# Patient Record
Sex: Female | Born: 1935 | Race: White | Hispanic: No | Smoking: Never smoker
Health system: Southern US, Community
[De-identification: ages and names within clinical notes are randomized; demographics above are authoritative.]

## PROBLEM LIST (undated history)

## (undated) DIAGNOSIS — R569 Unspecified convulsions: Secondary | ICD-10-CM

## (undated) DIAGNOSIS — F32A Depression, unspecified: Secondary | ICD-10-CM

## (undated) DIAGNOSIS — I10 Essential (primary) hypertension: Secondary | ICD-10-CM

## (undated) DIAGNOSIS — F0390 Unspecified dementia without behavioral disturbance: Secondary | ICD-10-CM

---

## 2017-09-08 DIAGNOSIS — E039 Hypothyroidism, unspecified: Secondary | ICD-10-CM | POA: Diagnosis present

## 2017-09-08 DIAGNOSIS — I10 Essential (primary) hypertension: Secondary | ICD-10-CM | POA: Diagnosis present

## 2017-09-08 DIAGNOSIS — F0390 Unspecified dementia without behavioral disturbance: Secondary | ICD-10-CM | POA: Diagnosis present

## 2019-01-13 DIAGNOSIS — G40909 Epilepsy, unspecified, not intractable, without status epilepticus: Secondary | ICD-10-CM | POA: Insufficient documentation

## 2020-02-16 DIAGNOSIS — G40919 Epilepsy, unspecified, intractable, without status epilepticus: Secondary | ICD-10-CM | POA: Insufficient documentation

## 2020-04-19 ENCOUNTER — Emergency Department (HOSPITAL_COMMUNITY): Payer: Medicare Other

## 2020-04-19 ENCOUNTER — Other Ambulatory Visit: Payer: Self-pay

## 2020-04-19 ENCOUNTER — Emergency Department (HOSPITAL_COMMUNITY)
Admission: EM | Admit: 2020-04-19 | Discharge: 2020-04-19 | Disposition: A | Discharge status: Home / Self Care | Payer: Medicare Other | Attending: Emergency Medicine | Admitting: Emergency Medicine

## 2020-04-19 DIAGNOSIS — R21 Rash and other nonspecific skin eruption: Secondary | ICD-10-CM

## 2020-04-19 DIAGNOSIS — F0390 Unspecified dementia without behavioral disturbance: Secondary | ICD-10-CM | POA: Diagnosis not present

## 2020-04-19 DIAGNOSIS — R4182 Altered mental status, unspecified: Secondary | ICD-10-CM | POA: Insufficient documentation

## 2020-04-19 LAB — URINALYSIS, ROUTINE W REFLEX MICROSCOPIC
Bacteria, UA: NONE SEEN
Bilirubin Urine: NEGATIVE
Glucose, UA: NEGATIVE mg/dL
Hgb urine dipstick: NEGATIVE
Ketones, ur: NEGATIVE mg/dL
Nitrite: NEGATIVE
Protein, ur: NEGATIVE mg/dL
Specific Gravity, Urine: 1.017 (ref 1.005–1.030)
pH: 6 (ref 5.0–8.0)

## 2020-04-19 LAB — CBC WITH DIFFERENTIAL/PLATELET
Abs Immature Granulocytes: 0.01 10*3/uL (ref 0.00–0.07)
Basophils Absolute: 0.1 10*3/uL (ref 0.0–0.1)
Basophils Relative: 1 %
Eosinophils Absolute: 0.3 10*3/uL (ref 0.0–0.5)
Eosinophils Relative: 7 %
HCT: 35 % — ABNORMAL LOW (ref 36.0–46.0)
Hemoglobin: 11.3 g/dL — ABNORMAL LOW (ref 12.0–15.0)
Immature Granulocytes: 0 %
Lymphocytes Relative: 18 %
Lymphs Abs: 0.9 10*3/uL (ref 0.7–4.0)
MCH: 27.6 pg (ref 26.0–34.0)
MCHC: 32.3 g/dL (ref 30.0–36.0)
MCV: 85.6 fL (ref 80.0–100.0)
Monocytes Absolute: 0.6 10*3/uL (ref 0.1–1.0)
Monocytes Relative: 12 %
Neutro Abs: 3.2 10*3/uL (ref 1.7–7.7)
Neutrophils Relative %: 62 %
Platelets: 212 10*3/uL (ref 150–400)
RBC: 4.09 MIL/uL (ref 3.87–5.11)
RDW: 15.2 % (ref 11.5–15.5)
WBC: 5.2 10*3/uL (ref 4.0–10.5)
nRBC: 0 % (ref 0.0–0.2)

## 2020-04-19 LAB — COMPREHENSIVE METABOLIC PANEL
ALT: 18 U/L (ref 0–44)
AST: 20 U/L (ref 15–41)
Albumin: 3.5 g/dL (ref 3.5–5.0)
Alkaline Phosphatase: 78 U/L (ref 38–126)
Anion gap: 5 (ref 5–15)
BUN: 17 mg/dL (ref 8–23)
CO2: 27 mmol/L (ref 22–32)
Calcium: 8.8 mg/dL — ABNORMAL LOW (ref 8.9–10.3)
Chloride: 105 mmol/L (ref 98–111)
Creatinine, Ser: 1.01 mg/dL — ABNORMAL HIGH (ref 0.44–1.00)
GFR, Estimated: 54 mL/min — ABNORMAL LOW (ref >60)
Glucose, Bld: 92 mg/dL (ref 70–99)
Potassium: 3.5 mmol/L (ref 3.5–5.1)
Sodium: 137 mmol/L (ref 135–145)
Total Bilirubin: 1.1 mg/dL (ref 0.3–1.2)
Total Protein: 6.8 g/dL (ref 6.5–8.1)

## 2020-04-19 LAB — TROPONIN I (HIGH SENSITIVITY)
Troponin I (High Sensitivity): 18 ng/L — ABNORMAL HIGH (ref <18)
Troponin I (High Sensitivity): 16 ng/L (ref <18)

## 2020-04-19 MED ORDER — NYSTATIN 100000 UNIT/GM EX CREA: TOPICAL_CREAM | CUTANEOUS | 0 refills | Status: DC

## 2020-04-19 MED ORDER — SODIUM CHLORIDE 0.9 % IV BOLUS
500.0000 mL | Freq: Once | INTRAVENOUS | Status: AC
  Administered 2020-04-19: 500 mL via INTRAVENOUS

## 2020-04-19 NOTE — ED Notes (Signed)
IV access established, pt tolerated well. Pt is sleeping at bedside. Easily awakened. Pt on monitor x4. Only c/o back pain ( chronic) fluids infusing. Will continue to monitor.

## 2020-04-19 NOTE — ED Triage Notes (Signed)
Pt to the ER after staff states AMS. Pt was referred to ER for evaluation. Pt said to be A& O x1 when she is normally A& O x3. cbg 329 per EMS with no history of diabetes.

## 2020-04-19 NOTE — ED Provider Notes (Signed)
Pt seen by Dr Judd Lien.  Please see his note.  Pt comes from a nursing facility.  Initial workup normal.  UA pending.  Anticipate dc.  Clinical Course as of 04/19/20 1501  Mon Apr 19, 2020  1300 UA not definitive for UTI [JK]  1458 Troponin normal.  [JK]    Clinical Course User Index [JK] Linwood Dibbles, MD      Linwood Dibbles, MD 04/19/20 1501

## 2020-04-19 NOTE — ED Provider Notes (Signed)
[facility]-EMERGENCY DEPT Provider Note   CSN: [id number] Arrival date & time: 04/19/20  5852     History No chief complaint on file.   Heidi Bean is a 87 y.o. female.  Patient is an 87 year old female with history of dementia.  She is sent from her extended care facility for evaluation of altered mental status.  According to the staff there, patient was alert and oriented x1 at 4 AM when she is normally alert and oriented x3.  There is a question of a nonreactive pupil on the right.  Patient denies to me she is experiencing any discomfort and has no idea why she is here.  She denies headache, falls, chest pain, difficulty breathing, or other complaints.  The history is provided by the patient, the EMS personnel and the nursing home.       No past medical history on file.  There are no problems to display for this patient.      OB History   No obstetric history on file.     No family history on file.     Home Medications Prior to Admission medications   Not on File    Allergies    Patient has no known allergies.  Review of Systems   Review of Systems  Unable to perform ROS: Dementia    Physical Exam Updated Vital Signs BP (!) 148/69 (BP Location: Left Arm)   Pulse 62   Temp 97.9 F (36.6 C) (Oral)   Resp 16   SpO2 96%   Physical Exam Vitals and nursing note reviewed.  Constitutional:      General: She is not in acute distress.    Appearance: She is well-developed and well-nourished. She is not diaphoretic.  HENT:     Head: Normocephalic and atraumatic.  Eyes:     Extraocular Movements: Extraocular movements intact.     Comments: The right pupil is elliptical in shape and I suspect related to prior eye procedure.  The left pupil is 3 mm and reactive.  Cardiovascular:     Rate and Rhythm: Normal rate and regular rhythm.     Heart sounds: No murmur heard. No friction rub. No gallop.   Pulmonary:     Effort: Pulmonary effort  is normal. No respiratory distress.     Breath sounds: Normal breath sounds. No wheezing.  Abdominal:     General: Bowel sounds are normal. There is no distension.     Palpations: Abdomen is soft.     Tenderness: There is no abdominal tenderness.  Musculoskeletal:        General: No swelling or tenderness. Normal range of motion.     Cervical back: Normal range of motion and neck supple.     Right lower leg: No edema.     Left lower leg: No edema.  Skin:    General: Skin is warm and dry.  Neurological:     Mental Status: She is alert.     Comments: Patient is awake and alert.  She answers questions appropriately and follows commands.  She moves all 4 extremities.     ED Results / Procedures / Treatments   Labs (all labs ordered are listed, but only abnormal results are displayed) Labs Reviewed  CBC WITH DIFFERENTIAL/PLATELET  URINALYSIS, ROUTINE W REFLEX MICROSCOPIC  COMPREHENSIVE METABOLIC PANEL  TROPONIN I (HIGH SENSITIVITY)    EKG EKG Interpretation  Date/Time:  Monday April 19 2020 06:03:27 EST Ventricular Rate:  63 PR Interval:  QRS Duration: 96 QT Interval:  430 QTC Calculation: 441 R Axis:   81 Text Interpretation: Sinus rhythm Borderline right axis deviation Low voltage, precordial leads Confirmed by Geoffery Lyons ([id number]) on 04/19/2020 6:08:57 AM   Radiology No results found.  Procedures Procedures (including critical care time)  Medications Ordered in ED Medications  sodium chloride 0.9 % bolus 500 mL (has no administration in time range)    ED Course  I have reviewed the triage vital signs and the nursing notes.  Pertinent labs & imaging results that were available during my care of the patient were reviewed by me and considered in my medical decision making (see chart for details).    MDM Rules/Calculators/A&P  With history of dementia and is a resident of [facility] nursing home.  She was sent here for confusion and disorientation.  Patient  arrives here alert and appropriate.  She has no complaints.  Work-up shows no significant abnormality in her CBC or basic metabolic panel.  Her EKG shows a sinus rhythm and troponin is unremarkable.  Head CT shows no acute process and patient is moving all extremities with no limitation.  She follows commands appropriately and is mentating appropriately.  We have little information at our facility on this patient as she is from [facility], then lived in [place] for 1 year with her son, then came to live here with the daughter about 1 year ago.  She has since been placed at [facility] since Thanksgiving.  I have discussed the situation with the patient's son in [place] and daughter in [place].  I have explained the results of the tests thus far and that patient will be likely discharged.  Her urinalysis is currently pending.  Care to be signed out to Dr. Lynelle Doctor at shift change.  He will obtain the results of the UA and determine the final disposition.  Discharge is anticipated unless something changes.  Final Clinical Impression(s) / ED Diagnoses Final diagnoses:  None    Rx / DC Orders ED Discharge Orders    None       Geoffery Lyons, MD 04/19/20 [id number]

## 2020-04-19 NOTE — ED Notes (Signed)
Called [facility] and gave report

## 2020-04-19 NOTE — ED Notes (Signed)
ED Provider at bedside. 

## 2020-04-19 NOTE — Discharge Instructions (Addendum)
Continue your current medications.  Use the cream the rash.  Follow-up with your doctor to be rechecked

## 2020-04-21 LAB — URINE CULTURE

## 2020-05-18 ENCOUNTER — Emergency Department (HOSPITAL_COMMUNITY)
Admission: EM | Admit: 2020-05-18 | Discharge: 2020-05-18 | Disposition: A | Discharge status: Skilled Nursing Facility | Payer: Medicare Other | Source: Home / Self Care | Attending: Emergency Medicine | Admitting: Emergency Medicine

## 2020-05-18 ENCOUNTER — Other Ambulatory Visit: Payer: Self-pay

## 2020-05-18 ENCOUNTER — Encounter (HOSPITAL_COMMUNITY): Payer: Self-pay | Admitting: *Deleted

## 2020-05-18 ENCOUNTER — Emergency Department (HOSPITAL_COMMUNITY): Payer: Medicare Other

## 2020-05-18 DIAGNOSIS — Z79899 Other long term (current) drug therapy: Secondary | ICD-10-CM | POA: Insufficient documentation

## 2020-05-18 DIAGNOSIS — S0083XA Contusion of other part of head, initial encounter: Secondary | ICD-10-CM | POA: Insufficient documentation

## 2020-05-18 DIAGNOSIS — I10 Essential (primary) hypertension: Secondary | ICD-10-CM | POA: Insufficient documentation

## 2020-05-18 DIAGNOSIS — F0390 Unspecified dementia without behavioral disturbance: Secondary | ICD-10-CM | POA: Insufficient documentation

## 2020-05-18 DIAGNOSIS — I959 Hypotension, unspecified: Secondary | ICD-10-CM | POA: Diagnosis not present

## 2020-05-18 DIAGNOSIS — W19XXXA Unspecified fall, initial encounter: Secondary | ICD-10-CM | POA: Insufficient documentation

## 2020-05-18 DIAGNOSIS — N179 Acute kidney failure, unspecified: Secondary | ICD-10-CM | POA: Diagnosis not present

## 2020-05-18 HISTORY — DX: Depression, unspecified: F32.A

## 2020-05-18 HISTORY — DX: Essential (primary) hypertension: I10

## 2020-05-18 HISTORY — DX: Unspecified convulsions: R56.9

## 2020-05-18 HISTORY — DX: Unspecified dementia, unspecified severity, without behavioral disturbance, psychotic disturbance, mood disturbance, and anxiety: F03.90

## 2020-05-18 MED ORDER — ACETAMINOPHEN 325 MG PO TABS: 650.0000 mg | ORAL_TABLET | Freq: Once | ORAL | Status: DC

## 2020-05-18 NOTE — ED Triage Notes (Signed)
BIB EMS unwitnessed, hematoma left forehead, pain noted, abrasion to rt shin. VSS Pt denies LOC.

## 2020-05-18 NOTE — ED Provider Notes (Signed)
[facility]-EMERGENCY DEPT Provider Note   CSN: [id number] Arrival date & time: 05/18/20  1311     History Chief Complaint  Patient presents with  . Fall    Heidi KitchensBobbie Chilton Bean is a 87 y.o. female.  Pt presents to the ED today with a fall.  She is from SNF and fell and hit her head.  She denies any pain.  Pt's fall was unwitnessed.  She has a hx of seizures, but pt did not have any post ictal period and it is at her baseline.  Pt has dementia and is a poor historian.          Past Medical History:  Diagnosis Date  . Dementia (HCC)   . Depression   . HTN (hypertension)   . Seizures (HCC)     There are no problems to display for this patient.    OB History   No obstetric history on file.     No family history on file.  Social History   Tobacco Use  . Smoking status: Never Smoker  . Smokeless tobacco: Never Used  Substance Use Topics  . Alcohol use: Never  . Drug use: Never    Home Medications Prior to Admission medications   Medication Sig Start Date End Date Taking? Authorizing Provider  acetaminophen (TYLENOL) 325 MG tablet Take 650 mg by mouth every 4 (four) hours as needed for mild pain, fever or headache.    [provider]  calcium-vitamin D (OSCAL WITH D) 500-200 MG-UNIT TABS tablet Take 1 tablet by mouth daily.    [provider]  Cholecalciferol (VITAMIN D) 50 MCG (2000 UT) tablet Take 2,000 Units by mouth 2 (two) times daily.    [provider]  donepezil (ARICEPT) 10 MG tablet Take 10 mg by mouth daily. 12/23/19   [provider]  escitalopram (LEXAPRO) 10 MG tablet Take 10 mg by mouth daily. 01/07/20   [provider]  gabapentin (NEURONTIN) 300 MG capsule Take 300 mg by mouth 3 (three) times daily. 01/07/20   [provider]  hydrochlorothiazide (HYDRODIURIL) 12.5 MG tablet Take 12.5 mg by mouth daily.    [provider]  levETIRAcetam (KEPPRA) 750 MG tablet Take 750 mg  by mouth 2 (two) times daily. 02/19/20   [provider]  levothyroxine (SYNTHROID) 25 MCG tablet Take 25 mcg by mouth daily. 02/20/20   [provider]  losartan (COZAAR) 50 MG tablet Take 50 mg by mouth daily. 12/15/19   [provider]  megestrol (MEGACE) 40 MG/ML suspension Take 10 mLs by mouth daily. 02/20/20   [provider]  melatonin 5 MG TABS Take 5 mg by mouth at bedtime.    [provider]  nystatin (MYCOSTATIN) 100000 UNIT/ML suspension Take 5 mLs by mouth 4 (four) times daily. SWISH AND SPIT    [provider]  nystatin cream (MYCOSTATIN) Apply to affected area 2 times daily (rash on abdomen) 04/19/20   Linwood DibblesKnapp, Jon, MD  sodium chloride 1 g tablet Take 1 g by mouth 3 (three) times daily. 02/19/20   [provider]    Allergies    Patient has no known allergies.  Review of Systems   Review of Systems  All other systems reviewed and are negative.   Physical Exam Updated Vital Signs BP 134/70 (BP Location: Left Arm)   Pulse 74   Temp 97.6 F (36.4 C) (Oral)   Resp 17   SpO2 100%   Physical Exam Vitals  and nursing note reviewed.  Constitutional:      Appearance: Normal appearance.  HENT:     Head: Normocephalic.      Right Ear: External ear normal.     Left Ear: External ear normal.     Nose: Nose normal.     Mouth/Throat:     Mouth: Mucous membranes are moist.     Pharynx: Oropharynx is clear.  Eyes:     Extraocular Movements: Extraocular movements intact.     Conjunctiva/sclera: Conjunctivae normal.     Pupils: Pupils are equal, round, and reactive to light.  Cardiovascular:     Rate and Rhythm: Normal rate and regular rhythm.     Pulses: Normal pulses.     Heart sounds: Normal heart sounds.  Pulmonary:     Effort: Pulmonary effort is normal.     Breath sounds: Normal breath sounds.  Abdominal:     General: Abdomen is flat. Bowel sounds are normal.     Palpations: Abdomen is soft.   Musculoskeletal:        General: Normal range of motion.     Cervical back: Normal range of motion and neck supple.  Skin:    General: Skin is warm.     Capillary Refill: Capillary refill takes less than 2 seconds.  Neurological:     General: No focal deficit present.     Mental Status: She is alert. Mental status is at baseline.  Psychiatric:        Mood and Affect: Mood normal.        Behavior: Behavior normal.     ED Results / Procedures / Treatments   Labs (all labs ordered are listed, but only abnormal results are displayed) Labs Reviewed - No data to display  EKG None  Radiology DG Chest 2 View  Result Date: 05/18/2020 CLINICAL DATA:  Unwitnessed fall EXAM: CHEST - 2 VIEW COMPARISON:  04/19/2020 FINDINGS: Heart size is upper limits of normal, unchanged. Atherosclerotic calcification of the aortic knob. No focal airspace consolidation, pleural effusion, or pneumothorax. Degenerative changes of the bilateral shoulders and thoracic spine. IMPRESSION: No active cardiopulmonary disease. Electronically Signed   By: Duanne Guess D.O.   On: 05/18/2020 15:21   DG Pelvis 1-2 Views  Result Date: 05/18/2020 CLINICAL DATA:  Fall, unwitnessed, pain EXAM: PELVIS - 1-2 VIEW COMPARISON:  None. FINDINGS: No pelvic fracture or diastasis. No evidence of hip dislocation on this frontal view. No suspicious focal osseous lesions. Marked degenerative disc disease in the visualized lower lumbar spine. IMPRESSION: No pelvic fracture. Electronically Signed   By: Delbert Phenix M.D.   On: 05/18/2020 15:21   CT Head Wo Contrast  Result Date: 05/18/2020 CLINICAL DATA:  Head trauma, minor. Neck trauma. Additional history provided: Patient reports fall today, hematoma to forehead. EXAM: CT HEAD WITHOUT CONTRAST CT CERVICAL SPINE WITHOUT CONTRAST TECHNIQUE: Multidetector CT imaging of the head and cervical spine was performed following the standard protocol without intravenous contrast. Multiplanar CT  image reconstructions of the cervical spine were also generated. COMPARISON:  Head CT 04/19/2020. FINDINGS: CT HEAD FINDINGS Brain: Mild-to-moderate cerebral atrophy. Moderate ill-defined hypoattenuation within the cerebral white matter is nonspecific, but compatible with chronic small vessel ischemic disease. Well circumscribed hypodensity within the inferior left basal ganglia likely reflecting a prominent perivascular space. There is no acute intracranial hemorrhage. No demarcated cortical infarct. No extra-axial fluid collection. No evidence of intracranial mass. No midline shift. Vascular: No hyperdense vessel.  Atherosclerotic calcifications Skull: Normal. Negative for  fracture or focal lesion. Sinuses/Orbits: Visualized orbits show no acute finding. Mild mucosal thickening and small mucous retention cyst within the anterior left ethmoid air cells. Other: Left forehead hematoma with possible laceration at this site. CT CERVICAL SPINE FINDINGS Alignment: Cervical levocurvature. Straightening of the expected cervical lordosis. 2 mm C2-C3 and C3-C4 grade 1 anterolisthesis. 2 mm C7-T1 grade 1 anterolisthesis. Skull base and vertebrae: The basion-dental and atlanto-dental intervals are maintained.No evidence of acute fracture to the cervical spine. Soft tissues and spinal canal: No prevertebral fluid or swelling. No visible canal hematoma. Disc levels: Advanced cervical spondylosis with multilevel disc space narrowing, disc bulges, uncovertebral hypertrophy and facet arthrosis. Ventral osteophytes at the C2-C3 through C7-T1 levels. Possible early osseous fusion across the T1-T2 disc space anteriorly and posteriorly. Multilevel bony neural foraminal narrowing. No appreciable high-grade spinal canal stenosis. Additional degenerative changes with pannus formation at C1-C2. Upper chest: No consolidation within the imaged lung apices. No visible pneumothorax. Other: Heterogeneous, enlarged left thyroid lobe. IMPRESSION:  CT head: 1. No evidence of acute intracranial abnormality. 2. Left forehead soft tissue swelling possible laceration at this site. 3. Mild-to-moderate cerebral atrophy with moderate cerebral white matter chronic small vessel ischemic disease. CT cervical spine: 1. No evidence of acute fracture to the cervical spine. 2. Nonspecific straightening of the expected cervical lordosis. 3. Cervical levocurvature. 4. 2 mm grade 1 anterolisthesis at C2-C3, C3-C4 and C7-T1. 5. Advanced cervical spondylosis as described. 6. Heterogeneous, enlarged left thyroid lobe. Given the patient's advanced age, thyroid ultrasound may be considered for further evaluation only as clinically appropriate. Electronically Signed   By: Jackey Loge DO   On: 05/18/2020 15:41   CT Cervical Spine Wo Contrast  Result Date: 05/18/2020 CLINICAL DATA:  Head trauma, minor. Neck trauma. Additional history provided: Patient reports fall today, hematoma to forehead. EXAM: CT HEAD WITHOUT CONTRAST CT CERVICAL SPINE WITHOUT CONTRAST TECHNIQUE: Multidetector CT imaging of the head and cervical spine was performed following the standard protocol without intravenous contrast. Multiplanar CT image reconstructions of the cervical spine were also generated. COMPARISON:  Head CT 04/19/2020. FINDINGS: CT HEAD FINDINGS Brain: Mild-to-moderate cerebral atrophy. Moderate ill-defined hypoattenuation within the cerebral white matter is nonspecific, but compatible with chronic small vessel ischemic disease. Well circumscribed hypodensity within the inferior left basal ganglia likely reflecting a prominent perivascular space. There is no acute intracranial hemorrhage. No demarcated cortical infarct. No extra-axial fluid collection. No evidence of intracranial mass. No midline shift. Vascular: No hyperdense vessel.  Atherosclerotic calcifications Skull: Normal. Negative for fracture or focal lesion. Sinuses/Orbits: Visualized orbits show no acute finding. Mild mucosal  thickening and small mucous retention cyst within the anterior left ethmoid air cells. Other: Left forehead hematoma with possible laceration at this site. CT CERVICAL SPINE FINDINGS Alignment: Cervical levocurvature. Straightening of the expected cervical lordosis. 2 mm C2-C3 and C3-C4 grade 1 anterolisthesis. 2 mm C7-T1 grade 1 anterolisthesis. Skull base and vertebrae: The basion-dental and atlanto-dental intervals are maintained.No evidence of acute fracture to the cervical spine. Soft tissues and spinal canal: No prevertebral fluid or swelling. No visible canal hematoma. Disc levels: Advanced cervical spondylosis with multilevel disc space narrowing, disc bulges, uncovertebral hypertrophy and facet arthrosis. Ventral osteophytes at the C2-C3 through C7-T1 levels. Possible early osseous fusion across the T1-T2 disc space anteriorly and posteriorly. Multilevel bony neural foraminal narrowing. No appreciable high-grade spinal canal stenosis. Additional degenerative changes with pannus formation at C1-C2. Upper chest: No consolidation within the imaged lung apices. No visible pneumothorax. Other: Heterogeneous, enlarged  left thyroid lobe. IMPRESSION: CT head: 1. No evidence of acute intracranial abnormality. 2. Left forehead soft tissue swelling possible laceration at this site. 3. Mild-to-moderate cerebral atrophy with moderate cerebral white matter chronic small vessel ischemic disease. CT cervical spine: 1. No evidence of acute fracture to the cervical spine. 2. Nonspecific straightening of the expected cervical lordosis. 3. Cervical levocurvature. 4. 2 mm grade 1 anterolisthesis at C2-C3, C3-C4 and C7-T1. 5. Advanced cervical spondylosis as described. 6. Heterogeneous, enlarged left thyroid lobe. Given the patient's advanced age, thyroid ultrasound may be considered for further evaluation only as clinically appropriate. Electronically Signed   By: Jackey Loge DO   On: 05/18/2020 15:41     Procedures Procedures   Medications Ordered in ED Medications - No data to display  ED Course  I have reviewed the triage vital signs and the nursing notes.  Pertinent labs & imaging results that were available during my care of the patient were reviewed by me and considered in my medical decision making (see chart for details).    MDM Rules/Calculators/A&P                          Pt able to ambulate.  She is at her baseline mentally.  No fx or intracranial abn.  Pt's son updated.  Pt is stable for d/c.  Return if worse.  Final Clinical Impression(s) / ED Diagnoses Final diagnoses:  Fall, initial encounter  Contusion of face, initial encounter    Rx / DC Orders ED Discharge Orders    None       Jacalyn Lefevre, MD 05/18/20 [phone]

## 2020-05-18 NOTE — ED Notes (Signed)
patient assisted to the bathroom x 2 person assist.

## 2020-05-21 ENCOUNTER — Inpatient Hospital Stay (HOSPITAL_COMMUNITY)
Admission: EM | Admit: 2020-05-21 | Discharge: 2020-05-24 | DRG: 682 | Disposition: A | Discharge status: Skilled Nursing Facility | Payer: Medicare Other | Source: Skilled Nursing Facility | Attending: Internal Medicine | Admitting: Internal Medicine

## 2020-05-21 ENCOUNTER — Emergency Department (HOSPITAL_COMMUNITY): Payer: Medicare Other

## 2020-05-21 ENCOUNTER — Encounter (HOSPITAL_COMMUNITY): Payer: Self-pay

## 2020-05-21 DIAGNOSIS — Z79899 Other long term (current) drug therapy: Secondary | ICD-10-CM

## 2020-05-21 DIAGNOSIS — R4182 Altered mental status, unspecified: Secondary | ICD-10-CM | POA: Diagnosis present

## 2020-05-21 DIAGNOSIS — N179 Acute kidney failure, unspecified: Principal | ICD-10-CM | POA: Diagnosis present

## 2020-05-21 DIAGNOSIS — E039 Hypothyroidism, unspecified: Secondary | ICD-10-CM | POA: Diagnosis present

## 2020-05-21 DIAGNOSIS — F0281 Dementia in other diseases classified elsewhere with behavioral disturbance: Secondary | ICD-10-CM | POA: Diagnosis present

## 2020-05-21 DIAGNOSIS — E861 Hypovolemia: Secondary | ICD-10-CM | POA: Diagnosis present

## 2020-05-21 DIAGNOSIS — G9341 Metabolic encephalopathy: Secondary | ICD-10-CM | POA: Diagnosis present

## 2020-05-21 DIAGNOSIS — I480 Paroxysmal atrial fibrillation: Secondary | ICD-10-CM | POA: Diagnosis present

## 2020-05-21 DIAGNOSIS — F0390 Unspecified dementia without behavioral disturbance: Secondary | ICD-10-CM | POA: Diagnosis present

## 2020-05-21 DIAGNOSIS — Z7982 Long term (current) use of aspirin: Secondary | ICD-10-CM | POA: Diagnosis not present

## 2020-05-21 DIAGNOSIS — I340 Nonrheumatic mitral (valve) insufficiency: Secondary | ICD-10-CM | POA: Diagnosis not present

## 2020-05-21 DIAGNOSIS — R000 Tachycardia, unspecified: Secondary | ICD-10-CM | POA: Diagnosis not present

## 2020-05-21 DIAGNOSIS — Z79890 Hormone replacement therapy (postmenopausal): Secondary | ICD-10-CM | POA: Diagnosis not present

## 2020-05-21 DIAGNOSIS — R471 Dysarthria and anarthria: Secondary | ICD-10-CM | POA: Diagnosis present

## 2020-05-21 DIAGNOSIS — Z20822 Contact with and (suspected) exposure to covid-19: Secondary | ICD-10-CM | POA: Diagnosis present

## 2020-05-21 DIAGNOSIS — G40A09 Absence epileptic syndrome, not intractable, without status epilepticus: Secondary | ICD-10-CM | POA: Diagnosis present

## 2020-05-21 DIAGNOSIS — R9431 Abnormal electrocardiogram [ECG] [EKG]: Secondary | ICD-10-CM | POA: Diagnosis present

## 2020-05-21 DIAGNOSIS — H3530 Unspecified macular degeneration: Secondary | ICD-10-CM | POA: Diagnosis present

## 2020-05-21 DIAGNOSIS — G928 Other toxic encephalopathy: Secondary | ICD-10-CM

## 2020-05-21 DIAGNOSIS — J690 Pneumonitis due to inhalation of food and vomit: Secondary | ICD-10-CM

## 2020-05-21 DIAGNOSIS — G309 Alzheimer's disease, unspecified: Secondary | ICD-10-CM | POA: Diagnosis present

## 2020-05-21 DIAGNOSIS — I361 Nonrheumatic tricuspid (valve) insufficiency: Secondary | ICD-10-CM | POA: Diagnosis not present

## 2020-05-21 DIAGNOSIS — E860 Dehydration: Secondary | ICD-10-CM | POA: Diagnosis present

## 2020-05-21 DIAGNOSIS — I4891 Unspecified atrial fibrillation: Secondary | ICD-10-CM | POA: Diagnosis not present

## 2020-05-21 DIAGNOSIS — E876 Hypokalemia: Secondary | ICD-10-CM | POA: Diagnosis present

## 2020-05-21 DIAGNOSIS — H9190 Unspecified hearing loss, unspecified ear: Secondary | ICD-10-CM | POA: Diagnosis present

## 2020-05-21 DIAGNOSIS — R7989 Other specified abnormal findings of blood chemistry: Secondary | ICD-10-CM | POA: Diagnosis present

## 2020-05-21 DIAGNOSIS — R296 Repeated falls: Secondary | ICD-10-CM | POA: Diagnosis present

## 2020-05-21 DIAGNOSIS — I10 Essential (primary) hypertension: Secondary | ICD-10-CM | POA: Diagnosis present

## 2020-05-21 DIAGNOSIS — W1830XA Fall on same level, unspecified, initial encounter: Secondary | ICD-10-CM | POA: Diagnosis present

## 2020-05-21 DIAGNOSIS — I959 Hypotension, unspecified: Secondary | ICD-10-CM | POA: Diagnosis present

## 2020-05-21 LAB — URINALYSIS, ROUTINE W REFLEX MICROSCOPIC
Bilirubin Urine: NEGATIVE
Glucose, UA: NEGATIVE mg/dL
Hgb urine dipstick: NEGATIVE
Ketones, ur: NEGATIVE mg/dL
Leukocytes,Ua: NEGATIVE
Nitrite: NEGATIVE
Protein, ur: NEGATIVE mg/dL
Specific Gravity, Urine: 1.01 (ref 1.005–1.030)
pH: 5 (ref 5.0–8.0)

## 2020-05-21 LAB — DIFFERENTIAL
Abs Immature Granulocytes: 0.03 10*3/uL (ref 0.00–0.07)
Basophils Absolute: 0.1 10*3/uL (ref 0.0–0.1)
Basophils Relative: 1 %
Eosinophils Absolute: 0.2 10*3/uL (ref 0.0–0.5)
Eosinophils Relative: 3 %
Immature Granulocytes: 0 %
Lymphocytes Relative: 14 %
Lymphs Abs: 1.1 10*3/uL (ref 0.7–4.0)
Monocytes Absolute: 0.6 10*3/uL (ref 0.1–1.0)
Monocytes Relative: 8 %
Neutro Abs: 5.6 10*3/uL (ref 1.7–7.7)
Neutrophils Relative %: 74 %

## 2020-05-21 LAB — COMPREHENSIVE METABOLIC PANEL
ALT: 18 U/L (ref 0–44)
AST: 23 U/L (ref 15–41)
Albumin: 3.1 g/dL — ABNORMAL LOW (ref 3.5–5.0)
Alkaline Phosphatase: 69 U/L (ref 38–126)
Anion gap: 10 (ref 5–15)
BUN: 13 mg/dL (ref 8–23)
CO2: 22 mmol/L (ref 22–32)
Calcium: 8.6 mg/dL — ABNORMAL LOW (ref 8.9–10.3)
Chloride: 105 mmol/L (ref 98–111)
Creatinine, Ser: 1.6 mg/dL — ABNORMAL HIGH (ref 0.44–1.00)
GFR, Estimated: 31 mL/min — ABNORMAL LOW (ref >60)
Glucose, Bld: 103 mg/dL — ABNORMAL HIGH (ref 70–99)
Potassium: 3.5 mmol/L (ref 3.5–5.1)
Sodium: 137 mmol/L (ref 135–145)
Total Bilirubin: 1.3 mg/dL — ABNORMAL HIGH (ref 0.3–1.2)
Total Protein: 5.9 g/dL — ABNORMAL LOW (ref 6.5–8.1)

## 2020-05-21 LAB — I-STAT CHEM 8, ED
BUN: 14 mg/dL (ref 8–23)
Calcium, Ion: 1.1 mmol/L — ABNORMAL LOW (ref 1.15–1.40)
Chloride: 105 mmol/L (ref 98–111)
Creatinine, Ser: 1.5 mg/dL — ABNORMAL HIGH (ref 0.44–1.00)
Glucose, Bld: 94 mg/dL (ref 70–99)
HCT: 35 % — ABNORMAL LOW (ref 36.0–46.0)
Hemoglobin: 11.9 g/dL — ABNORMAL LOW (ref 12.0–15.0)
Potassium: 3.5 mmol/L (ref 3.5–5.1)
Sodium: 139 mmol/L (ref 135–145)
TCO2: 23 mmol/L (ref 22–32)

## 2020-05-21 LAB — RAPID URINE DRUG SCREEN, HOSP PERFORMED
Amphetamines: NOT DETECTED
Barbiturates: NOT DETECTED
Benzodiazepines: NOT DETECTED
Cocaine: NOT DETECTED
Opiates: NOT DETECTED
Tetrahydrocannabinol: NOT DETECTED

## 2020-05-21 LAB — RESP PANEL BY RT-PCR (FLU A&B, COVID) ARPGX2
Influenza A by PCR: NEGATIVE
Influenza B by PCR: NEGATIVE
SARS Coronavirus 2 by RT PCR: NEGATIVE

## 2020-05-21 LAB — LACTIC ACID, PLASMA: Lactic Acid, Venous: 2 mmol/L (ref 0.5–1.9)

## 2020-05-21 LAB — CBC
HCT: 37.7 % (ref 36.0–46.0)
Hemoglobin: 11.9 g/dL — ABNORMAL LOW (ref 12.0–15.0)
MCH: 27.7 pg (ref 26.0–34.0)
MCHC: 31.6 g/dL (ref 30.0–36.0)
MCV: 87.9 fL (ref 80.0–100.0)
Platelets: 202 10*3/uL (ref 150–400)
RBC: 4.29 MIL/uL (ref 3.87–5.11)
RDW: 15 % (ref 11.5–15.5)
WBC: 7.6 10*3/uL (ref 4.0–10.5)
nRBC: 0 % (ref 0.0–0.2)

## 2020-05-21 LAB — PROTIME-INR
INR: 1.1 (ref 0.8–1.2)
Prothrombin Time: 14.1 seconds (ref 11.4–15.2)

## 2020-05-21 LAB — APTT: aPTT: 32 seconds (ref 24–36)

## 2020-05-21 LAB — AMMONIA: Ammonia: 29 umol/L (ref 9–35)

## 2020-05-21 LAB — CBG MONITORING, ED: Glucose-Capillary: 87 mg/dL (ref 70–99)

## 2020-05-21 LAB — ETHANOL: Alcohol, Ethyl (B): <10 mg/dL (ref <10)

## 2020-05-21 LAB — VITAMIN B12: Vitamin B-12: 234 pg/mL (ref 180–914)

## 2020-05-21 LAB — TSH: TSH: 1.276 u[IU]/mL (ref 0.350–4.500)

## 2020-05-21 MED ORDER — LACTATED RINGERS IV BOLUS
1000.0000 mL | Freq: Once | INTRAVENOUS | Status: AC
  Administered 2020-05-21: 1000 mL via INTRAVENOUS

## 2020-05-21 MED ORDER — LEVETIRACETAM IN NACL 1000 MG/100ML IV SOLN
1000.0000 mg | INTRAVENOUS | Status: AC
  Administered 2020-05-21: 1000 mg via INTRAVENOUS
  Filled 2020-05-21: qty 100

## 2020-05-21 MED ORDER — HEPARIN SODIUM (PORCINE) 5000 UNIT/ML IJ SOLN
5000.0000 [IU] | Freq: Two times a day (BID) | INTRAMUSCULAR | Status: DC
  Administered 2020-05-21 – 2020-05-24 (×6): 5000 [IU] via SUBCUTANEOUS
  Filled 2020-05-21 (×5): qty 1

## 2020-05-21 MED ORDER — LEVOTHYROXINE SODIUM 25 MCG PO TABS
25.0000 ug | ORAL_TABLET | Freq: Every day | ORAL | Status: DC
  Administered 2020-05-23 – 2020-05-24 (×2): 25 ug via ORAL
  Filled 2020-05-21 (×2): qty 1

## 2020-05-21 MED ORDER — SODIUM CHLORIDE 1 G PO TABS
1.0000 g | ORAL_TABLET | Freq: Three times a day (TID) | ORAL | Status: DC
  Administered 2020-05-21 – 2020-05-22 (×3): 1 g via ORAL
  Filled 2020-05-21 (×5): qty 1

## 2020-05-21 MED ORDER — LORAZEPAM 2 MG/ML IJ SOLN: 4.0000 mg | INTRAMUSCULAR | Status: DC | PRN

## 2020-05-21 MED ORDER — LEVETIRACETAM 750 MG PO TABS
750.0000 mg | ORAL_TABLET | Freq: Two times a day (BID) | ORAL | Status: DC
  Administered 2020-05-21: 750 mg via ORAL
  Filled 2020-05-21: qty 1

## 2020-05-21 MED ORDER — ACETAMINOPHEN 325 MG PO TABS
650.0000 mg | ORAL_TABLET | ORAL | Status: DC | PRN
  Administered 2020-05-22: 650 mg via ORAL
  Filled 2020-05-21: qty 2

## 2020-05-21 MED ORDER — MELATONIN 5 MG PO TABS
5.0000 mg | ORAL_TABLET | Freq: Every day | ORAL | Status: DC
  Administered 2020-05-21 – 2020-05-23 (×3): 5 mg via ORAL
  Filled 2020-05-21 (×3): qty 1

## 2020-05-21 MED ORDER — ESCITALOPRAM OXALATE 10 MG PO TABS
10.0000 mg | ORAL_TABLET | Freq: Every day | ORAL | Status: DC
Start: 2020-05-21 — End: 2020-05-24
  Administered 2020-05-23 – 2020-05-24 (×2): 10 mg via ORAL
  Filled 2020-05-21 (×2): qty 1

## 2020-05-21 MED ORDER — SODIUM CHLORIDE 0.9 % IV SOLN
75.0000 mL/h | INTRAVENOUS | Status: DC
  Administered 2020-05-21: 75 mL/h via INTRAVENOUS

## 2020-05-21 MED ORDER — GABAPENTIN 300 MG PO CAPS
300.0000 mg | ORAL_CAPSULE | Freq: Three times a day (TID) | ORAL | Status: DC
  Administered 2020-05-21: 300 mg via ORAL
  Filled 2020-05-21: qty 1

## 2020-05-21 MED ORDER — ASPIRIN 300 MG RE SUPP
300.0000 mg | Freq: Every day | RECTAL | Status: DC
  Administered 2020-05-21 – 2020-05-22 (×2): 300 mg via RECTAL
  Filled 2020-05-21: qty 1

## 2020-05-21 MED ORDER — HYDRALAZINE HCL 20 MG/ML IJ SOLN: 2.0000 mg | Freq: Four times a day (QID) | INTRAMUSCULAR | Status: DC | PRN

## 2020-05-21 MED ORDER — DONEPEZIL HCL 10 MG PO TABS
10.0000 mg | ORAL_TABLET | Freq: Every day | ORAL | Status: DC
  Filled 2020-05-21: qty 1

## 2020-05-21 MED ORDER — NYSTATIN 100000 UNIT/ML MT SUSP
5.0000 mL | Freq: Four times a day (QID) | OROMUCOSAL | Status: DC
  Administered 2020-05-22 – 2020-05-24 (×8): 500000 [IU] via ORAL
  Filled 2020-05-21 (×9): qty 5

## 2020-05-21 NOTE — ED Notes (Signed)
Updated pt's son on the phone. Pt also able to conversate with son, speech much more clear when speaking with son

## 2020-05-21 NOTE — ED Triage Notes (Signed)
Pt arrived by EMS from [facility] facility. Staff called concerned about change in mental status. LKN was 1100 05/21/20 Pt with inappropriate verbal responses to questions. Withdrawing from pain but not following commands.   Hx of seizures and dementia. Staff states pt is normally bed bound and can care a conversation  Pt was previously see in ED Sunday for fall, with head trauma

## 2020-05-21 NOTE — Progress Notes (Signed)
EEG complete - results pending 

## 2020-05-21 NOTE — ED Provider Notes (Signed)
[facility] EMERGENCY DEPARTMENT Provider Note   CSN: [id number] Arrival date & time: 05/21/20  1322     History No chief complaint on file.   Heidi Bean is a 87 y.o. female.  Report of altered mental status.  Found altered at 11 AM this morning.  Glucose via EMS was 87.  Was with lower blood pressure.  EMS noted a pupillary discrepancy.  Code stroke was called.  Patient states she has pain only in her back and states it is because she is laying uncomfortably on the CT table.  She cannot comment on recent illness or injury but does have hospital records showing a recent fall.  Patient has a history of seizure disorder.        Past Medical History:  Diagnosis Date  . Dementia (HCC)   . Depression   . HTN (hypertension)   . Seizures [facility])     Patient Active Problem List   Diagnosis Date Noted  . AMS (altered mental status) 05/21/2020  . Breakthrough seizure (HCC) 02/16/2020  . Dementia (HCC) 09/08/2017  . Essential hypertension 09/08/2017  . Hypothyroidism 09/08/2017       OB History   No obstetric history on file.     History reviewed. No pertinent family history.  Social History   Tobacco Use  . Smoking status: Never Smoker  . Smokeless tobacco: Never Used  Substance Use Topics  . Alcohol use: Never  . Drug use: Never    Home Medications Prior to Admission medications   Medication Sig Start Date End Date Taking? Authorizing Provider  acetaminophen (TYLENOL) 325 MG tablet Take 650 mg by mouth every 4 (four) hours as needed for mild pain, fever or headache.   Yes [provider]  calcium-vitamin D (OSCAL WITH D) 500-200 MG-UNIT TABS tablet Take 1 tablet by mouth daily.   Yes [provider]  Cholecalciferol (VITAMIN D) 50 MCG (2000 UT) tablet Take 2,000 Units by mouth 2 (two) times daily.   Yes [provider]  donepezil (ARICEPT) 10 MG tablet Take 10 mg by mouth daily. 12/23/19  Yes [provider]   escitalopram (LEXAPRO) 10 MG tablet Take 10 mg by mouth daily. 01/07/20  Yes [provider]  gabapentin (NEURONTIN) 300 MG capsule Take 300 mg by mouth 3 (three) times daily. 01/07/20  Yes [provider]  hydrochlorothiazide (HYDRODIURIL) 12.5 MG tablet Take 12.5 mg by mouth daily.   Yes [provider]  levETIRAcetam (KEPPRA) 750 MG tablet Take 750 mg by mouth 2 (two) times daily. 02/19/20  Yes [provider]  levothyroxine (SYNTHROID) 25 MCG tablet Take 25 mcg by mouth daily. 02/20/20  Yes [provider]  losartan (COZAAR) 50 MG tablet Take 50 mg by mouth daily. 12/15/19  Yes [provider]  megestrol (MEGACE) 40 MG/ML suspension Take 10 mLs by mouth daily. For appetite 02/20/20  Yes [provider]  melatonin 5 MG TABS Take 5 mg by mouth at bedtime.   Yes [provider]  nystatin (MYCOSTATIN) 100000 UNIT/ML suspension Take 5 mLs by mouth 4 (four) times daily. SWISH AND SPIT   Yes [provider]  nystatin cream (MYCOSTATIN) Apply to affected area 2 times daily (rash on abdomen) 04/19/20  Yes Linwood Dibbles, MD  sodium chloride 1 g tablet Take 1 g by mouth 3 (three) times daily. 02/19/20  Yes [provider]  aspirin EC 81 MG EC tablet Take 1 tablet (81 mg total) by mouth daily.  Swallow whole. 05/24/20   Pokhrel, Rebekah ChesterfieldLaxman, MD  magnesium oxide (MAG-OX) 400 (241.3 Mg) MG tablet Take 1 tablet (400 mg total) by mouth 2 (two) times daily for 10 days. 05/24/20 06/03/20  Pokhrel, Rebekah ChesterfieldLaxman, MD  metoprolol tartrate (LOPRESSOR) 25 MG tablet Take 1 tablet (25 mg total) by mouth 2 (two) times daily. 05/24/20 05/24/21  Joycelyn DasPokhrel, Laxman, MD    Allergies    Patient has no known allergies.  Review of Systems   Review of Systems  Unable to perform ROS: Acuity of condition    Physical Exam Updated Vital Signs BP 131/86 (BP Location: Left Arm)   Pulse (!) 57   Temp 98.1 F (36.7 C) (Oral)   Resp 18   Ht 5\' 2"  (1.575 m)    Wt 73.1 kg   SpO2 97%   BMI 29.48 kg/m   Physical Exam Vitals and nursing note reviewed. Exam conducted with a chaperone present.  Constitutional:      General: She is not in acute distress.    Appearance: Normal appearance.  HENT:     Head: Normocephalic and atraumatic.     Nose: No rhinorrhea.  Eyes:     General:        Right eye: No discharge.        Left eye: No discharge.     Conjunctiva/sclera: Conjunctivae normal.     Comments: Right pupil larger nonreactive to light, slight deformity, normal left pupil reactive to light.  Cardiovascular:     Rate and Rhythm: Normal rate and regular rhythm.  Pulmonary:     Effort: Pulmonary effort is normal. No respiratory distress.     Breath sounds: No stridor.  Abdominal:     General: Abdomen is flat. There is no distension.     Palpations: Abdomen is soft.  Musculoskeletal:        General: No tenderness or signs of injury.  Skin:    General: Skin is warm and dry.     Comments: Erythematous skin breakdown under the left pannus  Neurological:     Mental Status: She is alert.     Comments: Moving upper and lower extremities equally but not fully cooperative with motor or sensory exam.  No facial droop no slurred speech but patient is slow to answer questions.  Psychiatric:        Mood and Affect: Mood normal.        Behavior: Behavior normal.     ED Results / Procedures / Treatments   Labs (all labs ordered are listed, but only abnormal results are displayed) Labs Reviewed  CBC - Abnormal; Notable for the following components:      Result Value   Hemoglobin 11.9 (*)    All other components within normal limits  COMPREHENSIVE METABOLIC PANEL - Abnormal; Notable for the following components:   Glucose, Bld 103 (*)    Creatinine, Ser 1.60 (*)    Calcium 8.6 (*)    Total Protein 5.9 (*)    Albumin 3.1 (*)    Total Bilirubin 1.3 (*)    GFR, Estimated 31 (*)    All other components within normal limits  LACTIC ACID, PLASMA  - Abnormal; Notable for the following components:   Lactic Acid, Venous 2.0 (*)    All other components within normal limits  LACTIC ACID, PLASMA - Abnormal; Notable for the following components:   Lactic Acid, Venous 4.2 (*)    All other components within normal limits  CBC WITH DIFFERENTIAL/PLATELET -  Abnormal; Notable for the following components:   Hemoglobin 11.5 (*)    HCT 35.9 (*)    All other components within normal limits  BASIC METABOLIC PANEL - Abnormal; Notable for the following components:   Potassium 2.9 (*)    CO2 20 (*)    Glucose, Bld 197 (*)    Creatinine, Ser 1.21 (*)    Calcium 8.1 (*)    GFR, Estimated 43 (*)    All other components within normal limits  MAGNESIUM - Abnormal; Notable for the following components:   Magnesium 1.6 (*)    All other components within normal limits  COMPREHENSIVE METABOLIC PANEL - Abnormal; Notable for the following components:   Glucose, Bld 133 (*)    Calcium 8.5 (*)    Total Protein 5.5 (*)    Albumin 2.7 (*)    All other components within normal limits  CBC - Abnormal; Notable for the following components:   Hemoglobin 11.5 (*)    HCT 35.5 (*)    All other components within normal limits  I-STAT CHEM 8, ED - Abnormal; Notable for the following components:   Creatinine, Ser 1.50 (*)    Calcium, Ion 1.10 (*)    Hemoglobin 11.9 (*)    HCT 35.0 (*)    All other components within normal limits  RESP PANEL BY RT-PCR (FLU A&B, COVID) ARPGX2  CULTURE, BLOOD (ROUTINE X 2)  CULTURE, BLOOD (ROUTINE X 2)  SARS CORONAVIRUS 2 (TAT 6-24 HRS)  ETHANOL  PROTIME-INR  APTT  DIFFERENTIAL  RAPID URINE DRUG SCREEN, HOSP PERFORMED  URINALYSIS, ROUTINE W REFLEX MICROSCOPIC  TSH  AMMONIA  VITAMIN B12  LACTIC ACID, PLASMA  PHOSPHORUS  TSH  CBG MONITORING, ED    EKG EKG Interpretation  Date/Time:  Friday May 21 2020 13:45:45 EST Ventricular Rate:  96 PR Interval:    QRS Duration: 86 QT Interval:  408 QTC  Calculation: 516 R Axis:   -39 Text Interpretation: Atrial fibrillation Left axis deviation Borderline T abnormalities, diffuse leads Prolonged QT interval Confirmed by Cherlynn Perches ([id number]) on 05/21/2020 2:33:14 PM   Radiology ECHOCARDIOGRAM COMPLETE  Result Date: 05/23/2020    ECHOCARDIOGRAM REPORT   Patient Name:   Parrish Kittelson Date of Exam: 05/23/2020 Medical Rec #:  [id number]    Height:       62.0 in Accession #:    [id number]   Weight:       161.2 lb Date of Birth:  30-Jan-1931   BSA:          1.744 m Patient Age:    89 years     BP:           130/92 mmHg Patient Gender: F            HR:           102 bpm. Exam Location:  Inpatient Procedure: 2D Echo Indications:    Atrial Fibrillation  History:        Patient has no prior history of Echocardiogram examinations.                 Risk Factors:Hypertension.  Sonographer:    Thurman Coyer RDCS (AE) Referring Phys: [id number] Reyne Dumas [facility] IMPRESSIONS  1. Left ventricular ejection fraction, by estimation, is 55 to 60%. The left ventricle has normal function. The left ventricle has no regional wall motion abnormalities. There is moderate left ventricular hypertrophy. Left ventricular diastolic function  could not be evaluated.  2. Right ventricular systolic  function is normal. The right ventricular size is normal. Tricuspid regurgitation signal is inadequate for assessing PA pressure.  3. Left atrial size was mildly dilated.  4. The mitral valve is normal in structure. Mild mitral valve regurgitation. No evidence of mitral stenosis.  5. The aortic valve is tricuspid. Aortic valve regurgitation is not visualized. No aortic stenosis is present.  6. The inferior vena cava is normal in size with greater than 50% respiratory variability, suggesting right atrial pressure of 3 mmHg. FINDINGS  Left Ventricle: Left ventricular ejection fraction, by estimation, is 55 to 60%. The left ventricle has normal function. The left ventricle has no regional wall motion  abnormalities. The left ventricular internal cavity size was normal in size. There is  moderate left ventricular hypertrophy. Left ventricular diastolic function could not be evaluated due to atrial fibrillation. Left ventricular diastolic function could not be evaluated. Right Ventricle: The right ventricular size is normal. Right ventricular systolic function is normal. Tricuspid regurgitation signal is inadequate for assessing PA pressure. The tricuspid regurgitant velocity is 2.58 m/s, and with an assumed right atrial  pressure of 8 mmHg, the estimated right ventricular systolic pressure is 34.6 mmHg. Left Atrium: Left atrial size was mildly dilated. Right Atrium: Right atrial size was normal in size. Pericardium: There is no evidence of pericardial effusion. Mitral Valve: The mitral valve is normal in structure. Mild mitral annular calcification. Mild mitral valve regurgitation. No evidence of mitral valve stenosis. Tricuspid Valve: The tricuspid valve is normal in structure. Tricuspid valve regurgitation is mild . No evidence of tricuspid stenosis. Aortic Valve: The aortic valve is tricuspid. Aortic valve regurgitation is not visualized. No aortic stenosis is present. Pulmonic Valve: The pulmonic valve was not well visualized. Pulmonic valve regurgitation is not visualized. No evidence of pulmonic stenosis. Aorta: The aortic root is normal in size and structure. Venous: The inferior vena cava is normal in size with greater than 50% respiratory variability, suggesting right atrial pressure of 3 mmHg. IAS/Shunts: No atrial level shunt detected by color flow Doppler.  LEFT VENTRICLE PLAX 2D LVIDd:         3.50 cm LVIDs:         2.80 cm LV PW:         1.50 cm LV IVS:        1.40 cm LVOT diam:     2.00 cm LV SV:         33 LV SV Index:   19 LVOT Area:     3.14 cm  RIGHT VENTRICLE TAPSE (M-mode): 1.2 cm LEFT ATRIUM             Index       RIGHT ATRIUM           Index LA diam:        3.40 cm 1.95 cm/m  RA Area:      17.70 cm LA Vol (A2C):   45.7 ml 26.20 ml/m RA Volume:   42.60 ml  24.43 ml/m LA Vol (A4C):   51.6 ml 29.59 ml/m LA Biplane Vol: 48.5 ml 27.81 ml/m  AORTIC VALVE LVOT Vmax:   58.97 cm/s LVOT Vmean:  38.067 cm/s LVOT VTI:    0.104 m  AORTA Ao Root diam: 3.10 cm TRICUSPID VALVE TR Peak grad:   26.6 mmHg TR Vmax:        258.00 cm/s  SHUNTS Systemic VTI:  0.10 m Systemic Diam: 2.00 cm Olga Millers MD Electronically signed by Olga Millers MD Signature  Date/Time: 05/23/2020/10:47:59 AM    Final     Procedures Procedures   Medications Ordered in ED Medications  acetaminophen (TYLENOL) tablet 650 mg (650 mg Oral Given 05/22/20 0133)  escitalopram (LEXAPRO) tablet 10 mg (10 mg Oral Given 05/24/20 1057)  levothyroxine (SYNTHROID) tablet 25 mcg (25 mcg Oral Given 05/24/20 0611)  melatonin tablet 5 mg (5 mg Oral Given 05/23/20 2152)  nystatin (MYCOSTATIN) 100000 UNIT/ML suspension 500,000 Units (500,000 Units Oral Given 05/24/20 1058)  heparin injection 5,000 Units (5,000 Units Subcutaneous Given 05/24/20 1058)  LORazepam (ATIVAN) injection 1 mg (has no administration in time range)  metoprolol tartrate (LOPRESSOR) injection 2.5 mg (2.5 mg Intravenous Given 05/22/20 1519)  metoprolol tartrate (LOPRESSOR) tablet 25 mg (25 mg Oral Given 05/24/20 1057)  donepezil (ARICEPT) tablet 10 mg (10 mg Oral Given 05/24/20 1058)  magnesium sulfate IVPB 2 g 50 mL (2 g Intravenous Not Given 05/23/20 1541)  aspirin EC tablet 81 mg (81 mg Oral Given 05/24/20 1057)  gabapentin (NEURONTIN) capsule 300 mg (300 mg Oral Given 05/24/20 1057)  levETIRAcetam (KEPPRA) tablet 750 mg (750 mg Oral Given 05/24/20 1058)  magnesium oxide (MAG-OX) tablet 400 mg (400 mg Oral Given 05/24/20 1057)  levETIRAcetam (KEPPRA) IVPB 1000 mg/100 mL premix (0 mg Intravenous Stopped 05/21/20 1420)  lactated ringers bolus 1,000 mL (0 mLs Intravenous Stopped 05/21/20 1459)  lactated ringers bolus 1,000 mL (1,000 mLs Intravenous New Bag/Given 05/22/20 0412)   cyanocobalamin ((VITAMIN B-12)) injection 500 mcg (500 mcg Subcutaneous Given 05/22/20 1012)  potassium chloride 10 mEq in 100 mL IVPB (10 mEq Intravenous New Bag/Given 05/22/20 1517)    ED Course  I have reviewed the triage vital signs and the nursing notes.  Pertinent labs & imaging results that were available during my care of the patient were reviewed by me and considered in my medical decision making (see chart for details).    MDM Rules/Calculators/A&P                          Patient was called as a code stroke.  Does have a history of seizures.  Neurology at bedside reviewed CT imaging is denied no acute bleed.  Will wait for radiology final read.  Thought is more likely that this is a postictal state she may have had seizure Keppra was given.  Patient had some lower blood pressure, no other abnormal vital signs we will get EKG we will get screening labs for metabolic disturbance causing altered mental status versus seizure.  Neurology recommends EEG.  Patient has abnormal pupil, it appears to be documented in previous notes.  Laboratory studies thus far show mild increase in creatinine hypokalemia.  Other lab studies still pending.  Patient will need further work-up for cause of altered mental status.  She responded well to 1 L of lactated Ringer's.  Elevation in creatinine after my review of the labs may be secondary to dehydration, there may be other metabolic derangements causing patient's symptoms.  This may be a postictal state from their seizure disorder.  Neurology will interpret EEG and will available for further consultation.  Patient will likely need admission.  Pt care was handed off to on coming provider at 1500.  Complete history and physical and current plan have been communicated.  Please refer to their note for the remainder of ED care and ultimate disposition.  Pt seen in conjunction with Dr. Wilkie Aye   Final Clinical Impression(s) / ED Diagnoses Final diagnoses:  Altered mental status, unspecified altered mental status type  Hypotension, unspecified hypotension type    Rx / DC Orders ED Discharge Orders         Ordered    aspirin EC 81 MG EC tablet  Daily        05/24/20 1026    magnesium oxide (MAG-OX) 400 (241.3 Mg) MG tablet  2 times daily        05/24/20 1026    metoprolol tartrate (LOPRESSOR) 25 MG tablet  2 times daily        05/24/20 1026    Increase activity slowly        05/24/20 1026    Diet - low sodium heart healthy        05/24/20 1026    Discharge instructions       Comments: Follow-up with your primary care provider at the skilled nursing facility in 3 to 5 days.  Continue to take medications as prescribed.  Please take fall precautions.  Continue physical therapy at the skilled nursing facility.   05/24/20 1026           Sabino Donovan, MD 05/24/20 [id number]

## 2020-05-21 NOTE — Code Documentation (Addendum)
Patient from [facility] assisted living facility and LSN at 1100. Staff had noticed that she had AMS and unequal pupils (they were unsure if this was baseline). [facility] arrived and activated a code stroke. Patient was taken to [facility] and met by the stroke team and EDP. Patient cleared and taken for CT. CT was negative for a bleed (see results below). NIHSS 29 (see documentation for details). Patient with limited verbal response and would not keep her eyes open. She has a hx of seizures, dementia, HTN. She was last seen over the weekend after a fall with a small hematoma. TPA not given r/t stroke not suspected. MD will order STAT EEG and eventually an MRI. Neurologist believes this is more likely a postictal response and not a stroke. Care Plan: q30 neuro checks. EEG, MRI, Keppra load. Hand off with Jacquelyn, RN.    "IMPRESSION: There is no acute intracranial hemorrhage or evidence of acute infarction. ASPECT score is 10. Stable chronic findings detailed Above."   Shakita Keir, Rande Brunt, RN  Stroke Response Nurse

## 2020-05-21 NOTE — Procedures (Signed)
Patient Name: Heidi Bean  MRN: [id number]  Epilepsy Attending: Charlsie Quest  Referring Physician/Provider:  Date: 05/21/2020 Duration: 32.04 mins  Patient history: 87yo F with h/o seizure who presented with ams, unequal pupils. EEG to evaluate for seizure  Level of alertness: Awake  AEDs during EEG study: LEV  Technical aspects: This EEG study was done with scalp electrodes positioned according to the 10-20 International system of electrode placement. Electrical activity was acquired at a sampling rate of 500Hz  and reviewed with a high frequency filter of 70Hz  and a low frequency filter of 1Hz . EEG data were recorded continuously and digitally stored.   Description: The posterior dominant rhythm consists of 8 Hz activity of moderate voltage (25-35 uV) seen predominantly in posterior head regions, symmetric and reactive to eye opening and eye closing. EEG showed intermittent generalized 3 to 6 Hz theta-delta slowing.  Hyperventilation and photic stimulation were not performed.     ABNORMALITY -Intermittent slow, generalized  IMPRESSION: This study is suggestive of mild diffuse encephalopathy, nonspecific etiology. No seizures or epileptiform discharges were seen throughout the recording.  Kellin Bartling 
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
## 2020-05-21 NOTE — H&P (Signed)
History and Physical    Heidi Bean UXN:[id number] DOB: 07/08/1930 DOA: 05/21/2020  PCP: Renford Dills, MD (Confirm with patient/family/NH records and if not entered, this has to be entered at [facility] point of entry) Patient coming from: Nursing home  I have personally briefly reviewed patient's old medical records in [facility]  Chief Complaint: AMS  HPI: Heidi Bean is a 87 y.o. female with medical history significant of other of dementia, HTN, seizure disorder (mixed absent-minded and tonic-clonic), PAF, was sent from nursing home for worsening of confusion.  Patient unable to provide any history, most history collected by nursing home note, and patient daughter over the phone. Daughter reported that patient had 2 episodes of seizure since November. The episode in November was tonic-clonic. And patient had another seizure sometime last month at nursing home. Patient had a mechanical fall 2 days ago and hit her head. She was sent to [facility], at that time, patient appeared to be awake alert. Patient sent home from the ED at night.  Today, patient was sent from nursing home for evaluation of wax and waning mentation. No reported seizure movement.  ED Course: Mentation waxing waning. CT head negative for acute finding. WBC count within normal limits. AKI with Cre 1.6 compared to 1.0 one month ago.  Review of Systems: Unable to perform, patient confused.  Past Medical History:  Diagnosis Date  . Dementia (HCC)   . Depression   . HTN (hypertension)   . Seizures (HCC)     History reviewed. No pertinent surgical history.   reports that she has never smoked. She has never used smokeless tobacco. She reports that she does not drink alcohol and does not use drugs.  No Known Allergies  History reviewed. No pertinent family history.   Prior to Admission medications   Medication Sig Start Date End Date Taking? Authorizing Provider  acetaminophen (TYLENOL) 325 MG tablet  Take 650 mg by mouth every 4 (four) hours as needed for mild pain, fever or headache.   Yes [provider]  calcium-vitamin D (OSCAL WITH D) 500-200 MG-UNIT TABS tablet Take 1 tablet by mouth daily.   Yes [provider]  Cholecalciferol (VITAMIN D) 50 MCG (2000 UT) tablet Take 2,000 Units by mouth 2 (two) times daily.   Yes [provider]  donepezil (ARICEPT) 10 MG tablet Take 10 mg by mouth daily. 12/23/19  Yes [provider]  escitalopram (LEXAPRO) 10 MG tablet Take 10 mg by mouth daily. 01/07/20  Yes [provider]  gabapentin (NEURONTIN) 300 MG capsule Take 300 mg by mouth 3 (three) times daily. 01/07/20  Yes [provider]  hydrochlorothiazide (HYDRODIURIL) 12.5 MG tablet Take 12.5 mg by mouth daily.   Yes [provider]  levETIRAcetam (KEPPRA) 750 MG tablet Take 750 mg by mouth 2 (two) times daily. 02/19/20  Yes [provider]  levothyroxine (SYNTHROID) 25 MCG tablet Take 25 mcg by mouth daily. 02/20/20  Yes [provider]  losartan (COZAAR) 50 MG tablet Take 50 mg by mouth daily. 12/15/19  Yes [provider]  megestrol (MEGACE) 40 MG/ML suspension Take 10 mLs by mouth daily. For appetite 02/20/20  Yes [provider]  melatonin 5 MG TABS Take 5 mg by mouth at bedtime.   Yes [provider]  nystatin (MYCOSTATIN) 100000 UNIT/ML suspension Take 5 mLs by mouth 4 (four) times daily. SWISH AND SPIT   Yes [provider]  nystatin cream (MYCOSTATIN) Apply to affected area  2 times daily (rash on abdomen) 04/19/20  Yes Linwood Dibbles, MD  sodium chloride 1 g tablet Take 1 g by mouth 3 (three) times daily. 02/19/20  Yes [provider]    Physical Exam: Vitals:   05/21/20 1700 05/21/20 1715 05/21/20 1745 05/21/20 1800  BP: (!) 114/52 (!) 88/40 (!) 138/126 99/74  Pulse: 65 76 87 96  Resp: 14 17 17 20   Temp:      TempSrc:      SpO2: 100% 98% 98% 100%     Constitutional: NAD, calm, comfortable Vitals:   05/21/20 1700 05/21/20 1715 05/21/20 1745 05/21/20 1800  BP: (!) 114/52 (!) 88/40 (!) 138/126 99/74  Pulse: 65 76 87 96  Resp: 14 17 17 20   Temp:      TempSrc:      SpO2: 100% 98% 98% 100%   Eyes: PERRL, lids and conjunctivae normal ENMT: Mucous membranes are dry. Posterior pharynx clear of any exudate or lesions.Normal dentition.  Neck: normal, supple, no masses, no thyromegaly Respiratory: clear to auscultation bilaterally, no wheezing, no crackles. Normal respiratory effort. No accessory muscle use.  Cardiovascular: Regular rate and rhythm, no murmurs / rubs / gallops. No extremity edema. 2+ pedal pulses. No carotid bruits.  Abdomen: no tenderness, no masses palpated. No hepatosplenomegaly. Bowel sounds positive.  Musculoskeletal: no clubbing / cyanosis. No joint deformity upper and lower extremities. Good ROM, no contractures. Normal muscle tone.  Skin: no rashes, lesions, ulcers. No induration Neurologic: Opens eyes, sleepy, not following commands Psychiatric: Sleepy  (Anything < 9 systems with 2 bullets each down codes to level 1) (If patient refuses exam can't bill higher level) (Make sure to document decubitus ulcers present on admission -- if possible -- and whether patient has chronic indwelling catheter at time of admission)  Labs on Admission: I have personally reviewed following labs and imaging studies  CBC: Recent Labs  Lab 05/21/20 1325 05/21/20 1332  WBC 7.6  --   NEUTROABS 5.6  --   HGB 11.9* 11.9*  HCT 37.7 35.0*  MCV 87.9  --   PLT 202  --    Basic Metabolic Panel: Recent Labs  Lab 05/21/20 1325 05/21/20 1332  NA 137 139  K 3.5 3.5  CL 105 105  CO2 22  --   GLUCOSE 103* 94  BUN 13 14  CREATININE 1.60* 1.50*  CALCIUM 8.6*  --    GFR: CrCl cannot be calculated (Unknown ideal weight.). Liver Function Tests: Recent Labs  Lab 05/21/20 1325  AST 23  ALT 18  ALKPHOS 69  BILITOT 1.3*   PROT 5.9*  ALBUMIN 3.1*   No results for input(s): LIPASE, AMYLASE in the last 168 hours. Recent Labs  Lab 05/21/20 1410  AMMONIA 29   Coagulation Profile: Recent Labs  Lab 05/21/20 1325  INR 1.1   Cardiac Enzymes: No results for input(s): CKTOTAL, CKMB, CKMBINDEX, TROPONINI in the last 168 hours. BNP (last 3 results) No results for input(s): PROBNP in the last 8760 hours. HbA1C: No results for input(s): HGBA1C in the last 72 hours. CBG: Recent Labs  Lab 05/21/20 1324  GLUCAP 87   Lipid Profile: No results for input(s): CHOL, HDL, LDLCALC, TRIG, CHOLHDL, LDLDIRECT in the last 72 hours. Thyroid Function Tests: Recent Labs    05/21/20 1435  TSH 1.276   Anemia Panel: Recent Labs    05/21/20 1435  VITAMINB12 234   Urine analysis:    Component Value Date/Time   COLORURINE YELLOW 05/21/2020 1755  APPEARANCEUR CLEAR 05/21/2020 1755   LABSPEC 1.010 05/21/2020 1755   PHURINE 5.0 05/21/2020 1755   GLUCOSEU NEGATIVE 05/21/2020 1755   HGBUR NEGATIVE 05/21/2020 1755   BILIRUBINUR NEGATIVE 05/21/2020 1755   KETONESUR NEGATIVE 05/21/2020 1755   PROTEINUR NEGATIVE 05/21/2020 1755   NITRITE NEGATIVE 05/21/2020 1755   LEUKOCYTESUR NEGATIVE 05/21/2020 1755    Radiological Exams on Admission: EEG adult  Result Date: 05/21/2020 Charlsie QuestYadav, Priyanka O, MD     05/21/2020  3:47 PM Patient Name: Heidi FastBobbie Hoxworth MRN: [id number] Epilepsy Attending: Charlsie QuestPriyanka O Yadav Referring Physician/Provider: Date: 05/21/2020 Duration: 32.04 mins Patient history: 87yo F with h/o seizure who presented with ams, unequal pupils. EEG to evaluate for seizure Level of alertness: Awake AEDs during EEG study: LEV Technical aspects: This EEG study was done with scalp electrodes positioned according to the 10-20 International system of electrode placement. Electrical activity was acquired at a sampling rate of 500Hz  and reviewed with a high frequency filter of 70Hz  and a low frequency filter of 1Hz . EEG data were  recorded continuously and digitally stored. Description: The posterior dominant rhythm consists of 8 Hz activity of moderate voltage (25-35 uV) seen predominantly in posterior head regions, symmetric and reactive to eye opening and eye closing. EEG showed intermittent generalized 3 to 6 Hz theta-delta slowing.  Hyperventilation and photic stimulation were not performed.   ABNORMALITY -Intermittent slow, generalized IMPRESSION: This study is suggestive of mild diffuse encephalopathy, nonspecific etiology. No seizures or epileptiform discharges were seen throughout the recording. Charlsie QuestPriyanka O Yadav   CT HEAD CODE STROKE WO CONTRAST  Result Date: 05/21/2020 CLINICAL DATA:  Code stroke. EXAM: CT HEAD WITHOUT CONTRAST TECHNIQUE: Contiguous axial images were obtained from the base of the skull through the vertex without intravenous contrast. COMPARISON:  05/18/2020 FINDINGS: Brain: No acute intracranial hemorrhage, mass effect, or edema. No new loss of gray-white differentiation. Patchy and confluent areas of hypoattenuation in the supratentorial white matter are nonspecific but probably reflect stable chronic microvascular ischemic changes. Prominence of the ventricles and sulci reflects generalized parenchymal volume loss. Disproportionate anteromedial temporal volume loss. Vascular: No hyperdense vessel. There is intracranial atherosclerotic calcification at the skull base. Skull: Unremarkable. Sinuses/Orbits: No acute abnormality. Other: Mastoid air cells are clear. ASPECTS (Alberta Stroke Program Early CT Score) - Ganglionic level infarction (caudate, lentiform nuclei, internal capsule, insula, M1-M3 cortex): 7 - Supraganglionic infarction (M4-M6 cortex): 3 Total score (0-10 with 10 being normal): 10 IMPRESSION: There is no acute intracranial hemorrhage or evidence of acute infarction. ASPECT score is 10. Stable chronic findings detailed above. These results were communicated to Dr. Wilford CornerArora at 1:42pm on 05/21/2020 by  text page via the [facility] messaging system. Electronically Signed   By: Guadlupe SpanishPraneil  Patel M.D.   On: 05/21/2020 13:44    EKG: Independently reviewed. A. fib, prolonged QTC  Assessment/Plan Active Problems:   AMS (altered mental status)  (please populate well all problems here in Problem List. (For example, if patient is on BP meds at home and you resume or decide to hold them, it is a problem that needs to be her. Same for CAD, COPD, HLD and so on)  Acute metabolic encephalopathy, GCS=8 -Discussed with neurology attending, initial impression is post ictal changes. EEG done in ED however did not show significant seizure activity. -Frequent neuro checks -Seizure precautions, fall precautions -Discussed with patient daughter over the phone, daughter not sure about patient CODE STATUS but will check with patient's son/POA who lives in [place] -Keppra loaded in ED, add as  needed Ativan -MRI when patient more awake -Appears to be sufficiently protect airway for now.  AKI -Clinically appears dehydrated and hypovolemia -Hold BP Meds, start IV fluid.  PAF -Rate controlled, daughter cannot remember why patient not anticoagulated. -Given the risk of embolic stroke in this case, will do MRI ASAP. -ASA suppository for now  Hypotension -Hold BP meds -Sepsis work-up, send Lactic acid, UA and chest x-ray unremarkable  Seizure disorder -As above  Advanced dementia -Aricept   DVT prophylaxis: Heparin subcu Code Status: Full Code for now Family Communication: Daughter over phone Disposition Plan: Expect another 2 midnight hospital stay for work-up of breakthrough seizure Consults called: Neurology Admission status: PCU   Emeline General MD [facility] Pager [phone]  05/21/2020, 6:46 PM

## 2020-05-21 NOTE — ED Provider Notes (Signed)
Change inAssumed care after signout.  Briefly this is an 87 year old female who presented to the emergency department with concern for change in mental status.  Please refer to previous note for full HPI.  Patient has had a bump in her creatinine but there does not appear to be an acute metabolic component to the status.  She has had periods of hypotension that has been fluid responsive.  Neurology was consulted, bedside EEG was not diagnostic.  Patient continues to have waxing and waning mental status changes. Physical Exam  BP (!) 114/52   Pulse 65   Temp (!) 96.9 F (36.1 C) (Axillary)   Resp 14   SpO2 100%   Physical Exam Vitals and nursing note reviewed.  HENT:     Head: Normocephalic.     Mouth/Throat:     Mouth: Mucous membranes are moist.  Cardiovascular:     Rate and Rhythm: Normal rate.  Pulmonary:     Effort: Pulmonary effort is normal. No respiratory distress.  Skin:    General: Skin is warm.  Neurological:     Mental Status: She is alert.     Comments: Patient is pleasant, follows commands but is confused and periodically somnolent  Psychiatric:        Mood and Affect: Mood normal.     ED Course/Procedures     Procedures  MDM  Patient is pending a urinalysis, neurology has reevaluated the patient.  Due to her labile blood pressures, intermittent mental status changes they have recommended observation and will continue to evaluate from a neurologic standpoint possible nonemergent MRI.  Patients evaluation and results requires admission for further treatment and care. Patient agrees with admission plan, offers no new complaints and is stable/unchanged at time of admit.       Rozelle Logan, DO 05/21/20 1741

## 2020-05-22 ENCOUNTER — Inpatient Hospital Stay (HOSPITAL_COMMUNITY): Payer: Medicare Other

## 2020-05-22 DIAGNOSIS — I10 Essential (primary) hypertension: Secondary | ICD-10-CM | POA: Diagnosis not present

## 2020-05-22 DIAGNOSIS — E039 Hypothyroidism, unspecified: Secondary | ICD-10-CM | POA: Diagnosis not present

## 2020-05-22 DIAGNOSIS — R4182 Altered mental status, unspecified: Secondary | ICD-10-CM

## 2020-05-22 DIAGNOSIS — F0390 Unspecified dementia without behavioral disturbance: Secondary | ICD-10-CM

## 2020-05-22 DIAGNOSIS — E876 Hypokalemia: Secondary | ICD-10-CM | POA: Diagnosis not present

## 2020-05-22 LAB — BASIC METABOLIC PANEL
Anion gap: 9 (ref 5–15)
BUN: 11 mg/dL (ref 8–23)
CO2: 20 mmol/L — ABNORMAL LOW (ref 22–32)
Calcium: 8.1 mg/dL — ABNORMAL LOW (ref 8.9–10.3)
Chloride: 107 mmol/L (ref 98–111)
Creatinine, Ser: 1.21 mg/dL — ABNORMAL HIGH (ref 0.44–1.00)
GFR, Estimated: 43 mL/min — ABNORMAL LOW (ref >60)
Glucose, Bld: 197 mg/dL — ABNORMAL HIGH (ref 70–99)
Potassium: 2.9 mmol/L — ABNORMAL LOW (ref 3.5–5.1)
Sodium: 136 mmol/L (ref 135–145)

## 2020-05-22 LAB — CBC WITH DIFFERENTIAL/PLATELET
Abs Immature Granulocytes: 0.02 10*3/uL (ref 0.00–0.07)
Basophils Absolute: 0.1 10*3/uL (ref 0.0–0.1)
Basophils Relative: 1 %
Eosinophils Absolute: 0.3 10*3/uL (ref 0.0–0.5)
Eosinophils Relative: 5 %
HCT: 35.9 % — ABNORMAL LOW (ref 36.0–46.0)
Hemoglobin: 11.5 g/dL — ABNORMAL LOW (ref 12.0–15.0)
Immature Granulocytes: 0 %
Lymphocytes Relative: 22 %
Lymphs Abs: 1.2 10*3/uL (ref 0.7–4.0)
MCH: 28.3 pg (ref 26.0–34.0)
MCHC: 32 g/dL (ref 30.0–36.0)
MCV: 88.2 fL (ref 80.0–100.0)
Monocytes Absolute: 0.4 10*3/uL (ref 0.1–1.0)
Monocytes Relative: 8 %
Neutro Abs: 3.5 10*3/uL (ref 1.7–7.7)
Neutrophils Relative %: 64 %
Platelets: 199 10*3/uL (ref 150–400)
RBC: 4.07 MIL/uL (ref 3.87–5.11)
RDW: 15 % (ref 11.5–15.5)
WBC: 5.5 10*3/uL (ref 4.0–10.5)
nRBC: 0 % (ref 0.0–0.2)

## 2020-05-22 LAB — LACTIC ACID, PLASMA
Lactic Acid, Venous: 4.2 mmol/L (ref 0.5–1.9)
Lactic Acid, Venous: 1.9 mmol/L (ref 0.5–1.9)

## 2020-05-22 MED ORDER — METOPROLOL TARTRATE 25 MG PO TABS
25.0000 mg | ORAL_TABLET | Freq: Two times a day (BID) | ORAL | Status: DC
  Administered 2020-05-22 – 2020-05-24 (×5): 25 mg via ORAL
  Filled 2020-05-22 (×5): qty 1

## 2020-05-22 MED ORDER — POTASSIUM CHLORIDE 10 MEQ/100ML IV SOLN
10.0000 meq | INTRAVENOUS | Status: AC
  Administered 2020-05-22 (×4): 10 meq via INTRAVENOUS
  Filled 2020-05-22 (×4): qty 100

## 2020-05-22 MED ORDER — LACTATED RINGERS IV SOLN: INTRAVENOUS | Status: DC

## 2020-05-22 MED ORDER — GABAPENTIN 300 MG PO CAPS
300.0000 mg | ORAL_CAPSULE | Freq: Two times a day (BID) | ORAL | Status: DC
  Administered 2020-05-22 – 2020-05-23 (×2): 300 mg via ORAL
  Filled 2020-05-22 (×2): qty 1

## 2020-05-22 MED ORDER — LACTATED RINGERS IV BOLUS
1000.0000 mL | Freq: Once | INTRAVENOUS | Status: AC
  Administered 2020-05-22: 1000 mL via INTRAVENOUS

## 2020-05-22 MED ORDER — METOPROLOL TARTRATE 5 MG/5ML IV SOLN
2.5000 mg | INTRAVENOUS | Status: DC | PRN
  Administered 2020-05-22: 2.5 mg via INTRAVENOUS
  Filled 2020-05-22: qty 5

## 2020-05-22 MED ORDER — LORAZEPAM 2 MG/ML IJ SOLN: 1.0000 mg | Freq: Once | INTRAMUSCULAR | Status: DC

## 2020-05-22 MED ORDER — SODIUM CHLORIDE 0.9 % IV SOLN
750.0000 mg | Freq: Two times a day (BID) | INTRAVENOUS | Status: DC
  Administered 2020-05-22 – 2020-05-23 (×3): 750 mg via INTRAVENOUS
  Filled 2020-05-22 (×4): qty 7.5

## 2020-05-22 MED ORDER — LORAZEPAM 2 MG/ML IJ SOLN: 1.0000 mg | INTRAMUSCULAR | Status: DC | PRN

## 2020-05-22 MED ORDER — CYANOCOBALAMIN 1000 MCG/ML IJ SOLN
500.0000 ug | Freq: Once | INTRAMUSCULAR | Status: AC
  Administered 2020-05-22: 500 ug via SUBCUTANEOUS
  Filled 2020-05-22: qty 1

## 2020-05-22 NOTE — Progress Notes (Signed)
Neurology Progress Note Subjective: Patient with multiple episodes of hypotension overnight (78/50 this morning at 6:30) with positive response to liter bolus of IV fluid. Again with hypotension during assessment this morning with a blood pressure of 84/56.  Lactate also elevated. Remains somnolent on initial assessment; with repeat assessment at 10:00 AM during rounding with attending provider, patient wakes to voice and is more interactive with examiner.   Exam: Vitals:   05/22/20 0600 05/22/20 0801  BP: 91/62 (!) 84/56  Pulse: 86 77  Resp: 17   Temp:    SpO2: 97% 97%   Gen: Somnolent, in no acute distress Resp: non-labored breathing, no acute distress Abd: soft, non-distended, non-tender  Neurological exam Initially 08:30 she is somnolent, no distress.  Does not opens eyes to voice. Attempts to state first name when asked with unintelligible speech. Moans with noxious stimuli, does not attempt to provide further answers to examiner questions. Speech is mildly dysarthric. Patient does not open eyes or attempt to track examiner. Poor attention and concentration. On rounds with attending at 10:00 she wakes to voice, provides her name clearly, states she is in the bed when asked about orientation to place, and states she is old when asked about age. She follows commands for attending provider.  Cranial nerves: Right pupil is irregular and nonreactive-likely surgical, left pupil is 2 mm, does not blink to threat consistently from either side, face appears symmetric. Motor exam: Vertical drift in all 4 extremities but no focality. Withdrawals to noxious stimuli, briskly on the lower extremities. Slowly on upper extremities. Attempts antigravity movement with lower extremities. Upper extremities with immediate drift to bed.  On reassessment at 10:00 she is able to lift her lower arms antigravity and bilateral lower extremities without drift to bed. Upper extremity strength assessment limited due  to limited patient effort.  Sensory exam: Grimaces equally and withdraws to noxious stimulation equally in all 4 extremities. Coordination cannot be performed  Pertinent Labs: Lab Results  Component Value Date   TSH 1.276 05/21/2020   Lab Results  Component Value Date   VITAMINB12 234 05/21/2020   Urinalysis    Component Value Date/Time   COLORURINE YELLOW 05/21/2020 1755   APPEARANCEUR CLEAR 05/21/2020 1755   LABSPEC 1.010 05/21/2020 1755   PHURINE 5.0 05/21/2020 1755   GLUCOSEU NEGATIVE 05/21/2020 1755   HGBUR NEGATIVE 05/21/2020 1755   BILIRUBINUR NEGATIVE 05/21/2020 1755   KETONESUR NEGATIVE 05/21/2020 1755   PROTEINUR NEGATIVE 05/21/2020 1755   NITRITE NEGATIVE 05/21/2020 1755   LEUKOCYTESUR NEGATIVE 05/21/2020 1755  CBC    Component Value Date/Time   WBC 5.5 05/22/2020 0242   RBC 4.07 05/22/2020 0242   HGB 11.5 (L) 05/22/2020 0242   HCT 35.9 (L) 05/22/2020 0242   PLT 199 05/22/2020 0242   MCV 88.2 05/22/2020 0242   MCH 28.3 05/22/2020 0242   MCHC 32.0 05/22/2020 0242   RDW 15.0 05/22/2020 0242   LYMPHSABS 1.2 05/22/2020 0242   MONOABS 0.4 05/22/2020 0242   EOSABS 0.3 05/22/2020 0242   BASOSABS 0.1 05/22/2020 0242  CMP     Component Value Date/Time   NA 136 05/22/2020 0242   K 2.9 (L) 05/22/2020 0242   CL 107 05/22/2020 0242   CO2 20 (L) 05/22/2020 0242   GLUCOSE 197 (H) 05/22/2020 0242   BUN 11 05/22/2020 0242   CREATININE 1.21 (H) 05/22/2020 0242   CALCIUM 8.1 (L) 05/22/2020 0242   PROT 5.9 (L) 05/21/2020 1325   ALBUMIN 3.1 (L) 05/21/2020 1325  AST 23 05/21/2020 1325   ALT 18 05/21/2020 1325   ALKPHOS 69 05/21/2020 1325   BILITOT 1.3 (H) 05/21/2020 1325   GFRNONAA 43 (L) 05/22/2020 0242  B12 level 234  Imaging: Dx Chest 2/22: IMPRESSION: No active cardiopulmonary disease.  CT head 2/25: There is no acute intracranial hemorrhage or evidence of acute infarction. ASPECT score is 10. Stable chronic findings detailed above.  EEG   IMPRESSION: "This study is suggestive of mild diffuse encephalopathy, nonspecific etiology. No seizures or epileptiform discharges were seen throughout the recording."  Assessment: 87 year old past history of dementia with behavioral disturbance, seizure disorder on Keppra, depression, hypertension, progressive decline in her cognition and mentation, brought in for altered mental status of facility where she lives in.  No witnessed seizure. Patient noted to have unequal pupils for which code stroke was activated because she also had a fall a few days ago-concern for ICH or subdural. Right pupil, is at baseline irregular and nonreactive after macular degeneration surgery. Her exam, is suggestive of encephalopathy and is nonfocal. My suspicion for stroke is low.  With history of seizures, postictal state is also possible. Polypharmacy should also be differentials along with underlying infection causing toxic metabolic encephalopathy.  She needs further work-up for encephalopathy including imaging which might also help [place] evaluate for/rule out stroke, MRI ordered, pending. Blood cultures pending.   Impression: Unwitnessed seizure followed by postictal state Toxic metabolic encephalopathy Underlying infection/sepsis Low suspicion for stroke  Recommendations: - Echocardiogram - MRI brain when able - Blood cultures pending - Vitamin B12 supplementation-parenteral followed by oral - Stabilize blood pressure per primary team - Inpatient seizure precautions - Minimize sedating medications at this time. -Decrease Gabapentin to 300 BID. OK to keep Lexapro at current dose since it is outpatient med - Keppra 750 mg IV BID - Ativan PRN for any seizure lasting > 5 minutes - CXR (do not see one for this admission)  Lanae Boast, AGACNP-BC [facility] [phone]   Attending addendum Patient seen and examined Imaging independently reviewed Agree with the history and physical  above Briefly, on examination, she was sleeping, opens eyes to voice, she is very hard of hearing, did follow my commands.  She was able to tell me her name.  Told me she was in her bed but could not tell me the place where she was at.  She is able to follow simple commands consistently.  She had no cranial nerve deficits.  She was able to raise both upper extremities at least 4/5.  Both lower extremities were also 4/5. No sensory deficits.  Difficult to assess for any dysmetria. Differentials still remain toxic metabolic encephalopathy versus unwitnessed seizure versus encephalopathy versus polypharmacy. EEG has been unremarkable MRI brain pending Recs above which I have formulated and edited. Plan d/w Dr. Tyson Babinski  -- Milon Dikes, MD Neurologist [facility] Pager: [phone]

## 2020-05-22 NOTE — Progress Notes (Signed)
   05/22/20 0801  Assess: MEWS Score  BP (!) 84/56  Pulse Rate 77  Level of Consciousness Responds to Pain  SpO2 97 %  O2 Device Room Air  Assess: MEWS Score  MEWS Temp 0  MEWS Systolic 1  MEWS Pulse 0  MEWS RR 0  MEWS LOC 2  MEWS Score 3  MEWS Score Color Yellow  Assess: if the MEWS score is Yellow or Red  Were vital signs taken at a resting state? Yes  Focused Assessment Change from prior assessment (see assessment flowsheet)  Early Detection of Sepsis Score *See Row Information* Low  MEWS guidelines implemented *See Row Information* No, previously yellow, continue vital signs every 4 hours  Treat  Pain Scale 0-10  Pain Score Asleep  Take Vital Signs  Increase Vital Sign Frequency  Yellow: Q 2hr X 2 then Q 4hr X 2, if remains yellow, continue Q 4hrs  Escalate  MEWS: Escalate Yellow: discuss with charge nurse/RN and consider discussing with provider and RRT  Notify: Charge Nurse/RN  Name of Charge Nurse/RN Notified Angela, RN  Date Charge Nurse/RN Notified 05/22/20  Time Charge Nurse/RN Notified 1771  Notify: Provider  Provider Name/Title Pokhrel, MD  Date Provider Notified 05/22/20  Time Provider Notified [phone]  Notification Type Page  Notification Reason Change in status;Other (Comment) (Increased lethargy)  Provider response See new orders  Date of Provider Response 05/22/20  Time of Provider Response [phone]  Notify: Rapid Response  Name of Rapid Response RN Notified Luisa Hart, RN  Date Rapid Response Notified 05/22/20  Time Rapid Response Notified 0810  Document  Patient Outcome Other (Comment) (Maintained status.  Continue to monitor.)  Progress note created (see row info) Yes

## 2020-05-22 NOTE — Progress Notes (Signed)
Patient BP 78/50 map 59, MEWS yellow, MD notified, 1000 ml Bolus ordered and administered. Bolus effective BP increased, MEWS documentation completed, patient stabilized, will continue to monitor.

## 2020-05-22 NOTE — Progress Notes (Signed)
Notified by RN that BP 78/50, MAP 59, HR 74-96. Pt was sleeping and hard to arouse.  Lactic acid increased to 4.2 from 2.  Ordered LR bolus 1000 ml then LR at 100 ml/hr. Check blood cultures.  Monitor BP response to IVF bolus

## 2020-05-22 NOTE — Progress Notes (Signed)
Patient extremely confused and disoriented to place, time, and situation.  ? If this is reflective of her elevated HR.  When HR was in 80's earlier in shift, patient had been lucid and able to carry on a conversation with staff and with her son, Mellody Dance, who is her POA.  Rhythm continued to present as A-fib with RVR throughout afternoon and MD kept abreast of her status.  STM also affected.  RR nurse also kept informed of her status.  MD ordered 1 mg of Ativan to be given x 1 for her agitation and confusion, but son stated he did not want for her to receive this medication as it would have "bad side effects" on her.  This was then held as per his request.

## 2020-05-22 NOTE — Progress Notes (Signed)
   05/22/20 1500  Assess: MEWS Score  BP 110/60  Pulse Rate (!) 168  ECG Heart Rate (!) 168  Resp 20  Level of Consciousness Alert  SpO2 100 %  O2 Device Room Air  Patient Activity (if Appropriate) In bed  Assess: MEWS Score  MEWS Temp 0  MEWS Systolic 0  MEWS Pulse 3  MEWS RR 0  MEWS LOC 0  MEWS Score 3  MEWS Score Color Yellow  Assess: if the MEWS score is Yellow or Red  Were vital signs taken at a resting state? Yes  Focused Assessment Change from prior assessment (see assessment flowsheet)  Early Detection of Sepsis Score *See Row Information* Low  MEWS guidelines implemented *See Row Information* No, previously yellow, continue vital signs every 4 hours  Take Vital Signs  Increase Vital Sign Frequency  Yellow: Q 2hr X 2 then Q 4hr X 2, if remains yellow, continue Q 4hrs  Escalate  MEWS: Escalate Yellow: discuss with charge nurse/RN and consider discussing with provider and RRT  Notify: Charge Nurse/RN  Name of Charge Nurse/RN Notified Angela, RN  Date Charge Nurse/RN Notified 05/22/20  Time Charge Nurse/RN Notified 1500  Notify: Provider  Provider Name/Title Pokhrel, MD  Date Provider Notified 05/22/20  Time Provider Notified 1500  Notification Type Page  Notification Reason Critical result;Other (Comment) (Elevatedf pulse (asymptomatic))  Provider response See new orders  Date of Provider Response 05/22/20  Time of Provider Response 1500   Pulse stabilized in A-fib - controlled post intervention with IV Metoprolol.

## 2020-05-22 NOTE — Progress Notes (Addendum)
PROGRESS NOTE  Heidi Bean QMV:[id number] DOB: 03-Apr-1930 DOA: 05/21/2020 PCP: Renford Dills, MD   LOS: 1 day   Brief narrative: Heidi Bean is 87 years old female with history of dementia, hypertension, seizure disorder paroxysmal atrial fibrillation was sent from skilled nursing facility with worsening confusion.  She did have a mechanical fall 2 days back and hit her head.  She was seen in the ED after a fall and everything was okay so she was sent back to the hospital but since discharge she has been more confused.  Repeat CT scan of the head was negative for acute findings.  WBC within normal limits.  Patient did have mild acute kidney injury with creatinine elevated at 1.6 compared to 1.0 one month back.  The patient was admitted to hospital for altered mental status, acute kidney injury.  Assessment/Plan:  Active Problems:   AMS (altered mental status)   Dementia (HCC)   Essential hypertension   Hypothyroidism  Altered mental status likely acute metabolic encephalopathy. -Neurology was consulted.  Patient with possible postictal state.  EEG however did not show any acute seizure-like activity.  Patient will be monitored with seizure precautions, fall precautions.  Keppra was given in the ED.  On as needed Ativan.  MRI when patient is more alert and awake.  More responsive at the time of my evaluation.  Will check 2D echocardiogram follow blood cultures urine cultures.  Doubt infection as an etiology.  Check chest x-ray.  Spoke with neurology Dr. Wilford Corner on the phone.  Empirically received a vitamin B12 injection.  Dose of gabapentin has been decreased.  Urine drug screen was negative.  TSH of 1.2.  Ammonia was 29.  No leukocytosis noted.  Elevated lactate.    Could be secondary to recent seizures/volume depletion.  Continue hydration.  Repeat lactate.  Acute kidney injury. Patient was clinically volume depleted.  Continue IV fluids.  Received IV boluses.  Hold antihypertensives.  Slightly  improved with IV fluid hydration.  Lab Results  Component Value Date   CREATININE 1.21 (H) 05/22/2020   CREATININE 1.50 (H) 05/21/2020   CREATININE 1.60 (H) 05/21/2020    Paroxysmal atrial fibrillation. Rate controlled, not on anticoagulation as outpatient.  Continue aspirin suppository.  Will need to check MRI of the brain when able.  Hypotension Likely secondary to volume depletion.  Hold antihypertensives.  Blood cultures have been sent.  Follow blood cultures.  Lactate was elevated at 4.2.  Will repeat lactate again.  We will continue to monitor closely. Urinalysis was negative for acute infection.  Chest x-ray without any acute infiltrate but will repeat chest x-ray today..   Seizure disorder Received Keppra load.  Will change Keppra to IV at this time since patient is unable to take orally.  History of dementia -Aricept on hold.  Hypokalemia.  Latest potassium of 2.9.  Will replace through IV.  Check levels in a.m.  DVT prophylaxis: heparin injection 5,000 Units Start: 05/21/20 2200  Code Status: Full code  Family Communication:  I spoke with the patient's daughter on the phone and updated her about the clinical condition of the patient.  Status is: Inpatient  Remains inpatient appropriate because:Altered mental status, Unsafe d/c plan, IV treatments appropriate due to intensity of illness or inability to take PO and Inpatient level of care appropriate due to severity of illness   Dispo: The patient is from: SNF              Anticipated d/c is to: SNF, will likely  need PT prior to discharge.              Patient currently is not medically stable to d/c.  Anticipated date of discharge.  2 days or more   Difficult to place patient No  Consultants:  Neurology  Procedures:  EEG  Anti-infectives:  . None  Anti-infectives (From admission, onward)   None      Subjective: Today, patient was seen and examined at bedside.  Nursing staff reported extreme  lethargy with unsafe p.o., required IV bolus for hypotension.  At the time of my examination patient was more alert awake and answering questions.  Stated that that she had right leg which was hurting.  She was sleepy but arousable.  Objective: Vitals:   05/22/20 0600 05/22/20 0801  BP: 91/62 (!) 84/56  Pulse: 86 77  Resp: 17   Temp:    SpO2: 97% 97%    Intake/Output Summary (Last 24 hours) at 05/22/2020 0806 Last data filed at 05/22/2020 0417 Gross per 24 hour  Intake 1100 ml  Output 400 ml  Net 700 ml   Filed Weights   05/22/20 0037  Weight: 73.1 kg   Body mass index is 29.48 kg/m.   Physical Exam: GENERAL: Patient is weepy but arousable, answering few questions. Not in obvious distress. HENT: No scleral pallor or icterus. Pupils equally reactive to light. Oral mucosa is dry NECK: is supple, no gross swelling noted. CHEST: Clear to auscultation. No crackles or wheezes.  Diminished breath sounds bilaterally. CVS: S1 and S2 heard, no murmur. Regular rate and rhythm.  ABDOMEN: Soft, non-tender, bowel sounds are present. EXTREMITIES: No edema. CNS: Sleepy but arousable, answering questions, moving all extremities SKIN: warm and dry without rashes.  Data Review: I have personally reviewed the following laboratory data and studies,  CBC: Recent Labs  Lab 05/21/20 1325 05/21/20 1332 05/22/20 0242  WBC 7.6  --  5.5  NEUTROABS 5.6  --  3.5  HGB 11.9* 11.9* 11.5*  HCT 37.7 35.0* 35.9*  MCV 87.9  --  88.2  PLT 202  --  199   Basic Metabolic Panel: Recent Labs  Lab 05/21/20 1325 05/21/20 1332 05/22/20 0242  NA 137 139 136  K 3.5 3.5 2.9*  CL 105 105 107  CO2 22  --  20*  GLUCOSE 103* 94 197*  BUN 13 14 11   CREATININE 1.60* 1.50* 1.21*  CALCIUM 8.6*  --  8.1*   Liver Function Tests: Recent Labs  Lab 05/21/20 1325  AST 23  ALT 18  ALKPHOS 69  BILITOT 1.3*  PROT 5.9*  ALBUMIN 3.1*   No results for input(s): LIPASE, AMYLASE in the last 168 hours. Recent  Labs  Lab 05/21/20 1410  AMMONIA 29   Cardiac Enzymes: No results for input(s): CKTOTAL, CKMB, CKMBINDEX, TROPONINI in the last 168 hours. BNP (last 3 results) No results for input(s): BNP in the last 8760 hours.  ProBNP (last 3 results) No results for input(s): PROBNP in the last 8760 hours.  CBG: Recent Labs  Lab 05/21/20 1324  GLUCAP 87   Recent Results (from the past 240 hour(s))  Resp Panel by RT-PCR (Flu A&B, Covid) Nasopharyngeal Swab     Status: None   Collection Time: 05/21/20  1:24 PM   Specimen: Nasopharyngeal Swab; Nasopharyngeal(NP) swabs in vial transport medium  Result Value Ref Range Status   SARS Coronavirus 2 by RT PCR NEGATIVE NEGATIVE Final    Comment: (NOTE) SARS-CoV-2 target nucleic acids are NOT  DETECTED.  The SARS-CoV-2 RNA is generally detectable in upper respiratory specimens during the acute phase of infection. The lowest concentration of SARS-CoV-2 viral copies this assay can detect is 138 copies/mL. A negative result does not preclude SARS-Cov-2 infection and should not be used as the sole basis for treatment or other patient management decisions. A negative result may occur with  improper specimen collection/handling, submission of specimen other than nasopharyngeal swab, presence of viral mutation(s) within the areas targeted by this assay, and inadequate number of viral copies(<138 copies/mL). A negative result must be combined with clinical observations, patient history, and epidemiological information. The expected result is Negative.  Fact Sheet for Patients:  BloggerCourse.com  Fact Sheet for Healthcare Providers:  SeriousBroker.it  This test is no t yet approved or cleared by the [place] FDA and  has been authorized for detection and/or diagnosis of SARS-CoV-2 by FDA under an Emergency Use Authorization (EUA). This EUA will remain  in effect (meaning this test can be used) for  the duration of the COVID-19 declaration under Section 564(b)(1) of the Act, 21 U.S.C.section 360bbb-3(b)(1), unless the authorization is terminated  or revoked sooner.       Influenza A by PCR NEGATIVE NEGATIVE Final   Influenza B by PCR NEGATIVE NEGATIVE Final    Comment: (NOTE) The Xpert Xpress SARS-CoV-2/FLU/RSV plus assay is intended as an aid in the diagnosis of influenza from Nasopharyngeal swab specimens and should not be used as a sole basis for treatment. Nasal washings and aspirates are unacceptable for Xpert Xpress SARS-CoV-2/FLU/RSV testing.  Fact Sheet for Patients: BloggerCourse.com  Fact Sheet for Healthcare Providers: SeriousBroker.it  This test is not yet approved or cleared by the [place] FDA and has been authorized for detection and/or diagnosis of SARS-CoV-2 by FDA under an Emergency Use Authorization (EUA). This EUA will remain in effect (meaning this test can be used) for the duration of the COVID-19 declaration under Section 564(b)(1) of the Act, 21 U.S.C. section 360bbb-3(b)(1), unless the authorization is terminated or revoked.  Performed at [facility], [address], [place], [place] [address]      Studies: EEG adult  Result Date: 2020-05-30 Heidi Quest, MD     05/30/20  3:47 PM Patient Name: Heidi Bean MRN: [id number] Epilepsy Attending: Charlsie Bean Referring Physician/Provider: Date: May 30, 2020 Duration: 32.04 mins Patient history: 87yo F with h/o seizure who presented with ams, unequal pupils. EEG to evaluate for seizure Level of alertness: Awake AEDs during EEG study: LEV Technical aspects: This EEG study was done with scalp electrodes positioned according to the 10-20 International system of electrode placement. Electrical activity was acquired at a sampling rate of 500Hz  and reviewed with a high frequency filter of 70Hz  and a low frequency filter of 1Hz . EEG data were  recorded continuously and digitally stored. Description: The posterior dominant rhythm consists of 8 Hz activity of moderate voltage (25-35 uV) seen predominantly in posterior head regions, symmetric and reactive to eye opening and eye closing. EEG showed intermittent generalized 3 to 6 Hz theta-delta slowing.  Hyperventilation and photic stimulation were not performed.   ABNORMALITY -Intermittent slow, generalized IMPRESSION: This study is suggestive of mild diffuse encephalopathy, nonspecific etiology. No seizures or epileptiform discharges were seen throughout the recording.   CT HEAD CODE STROKE WO CONTRAST  Result Date: 30-May-2020 CLINICAL DATA:  Code stroke. EXAM: CT HEAD WITHOUT CONTRAST TECHNIQUE: Contiguous axial images were obtained from the base of the skull through the  vertex without intravenous contrast. COMPARISON:  05/18/2020 FINDINGS: Brain: No acute intracranial hemorrhage, mass effect, or edema. No new loss of gray-white differentiation. Patchy and confluent areas of hypoattenuation in the supratentorial white matter are nonspecific but probably reflect stable chronic microvascular ischemic changes. Prominence of the ventricles and sulci reflects generalized parenchymal volume loss. Disproportionate anteromedial temporal volume loss. Vascular: No hyperdense vessel. There is intracranial atherosclerotic calcification at the skull base. Skull: Unremarkable. Sinuses/Orbits: No acute abnormality. Other: Mastoid air cells are clear. ASPECTS ([place] Stroke Program Early CT Score) - Ganglionic level infarction (caudate, lentiform nuclei, internal capsule, insula, M1-M3 cortex): 7 - Supraganglionic infarction (M4-M6 cortex): 3 Total score (0-10 with 10 being normal): 10 IMPRESSION: There is no acute intracranial hemorrhage or evidence of acute infarction. ASPECT score is 10. Stable chronic findings detailed above. These results were communicated to Dr. Wilford Corner at 1:42pm on 05/21/2020 by  text page via the [facility] messaging system. Electronically Signed   By: Guadlupe Spanish M.D.   On: 05/21/2020 13:44      Joycelyn Das, MD  [facility] 05/22/2020  If 7PM-7AM, please contact night-coverage

## 2020-05-22 NOTE — Evaluation (Signed)
Physical Therapy Evaluation Patient Details Name: Heidi Bean MRN: [id number] DOB: 12-05-1930 Today's Date: 05/22/2020   History of Present Illness  87 year old past history of dementia with behavioral disturbance, seizure disorder on Keppra, depression, hypertension, PAF progressive decline in her cognition and mentation, brought in for altered mental status of facility where she lives  Clinical Impression  Pt admitted with above diagnosis. Pt unable to give accurate history but expect this is her baseline, but she was alert and answered all questions as best she could. Pt fearful of falling so somewhat fearful of coming to EOB. Required max A for LE's off front EOB and elevation of trunk into sitting. Pt needed min A EOB initially but progressed to supervision. Pt stood with mod A for power up but was unable to step feet or maintain standing more than 1 min. Mod A given to scoot laterally to EOB before returning to supine.  Pt currently with functional limitations due to the deficits listed below (see PT Problem List). Pt will benefit from skilled PT to increase their independence and safety with mobility to allow discharge to the venue listed below.       Follow Up Recommendations SNF    Equipment Recommendations  None recommended by PT    Recommendations for Other Services       Precautions / Restrictions Precautions Precautions: Fall Precaution Comments: pt fell 2-3x PTA, does not remember them Restrictions Weight Bearing Restrictions: No      Mobility  Bed Mobility Overal bed mobility: Needs Assistance Bed Mobility: Supine to Sit;Sit to Supine     Supine to sit: Max assist Sit to supine: Max assist   General bed mobility comments: pt attempted to come to EOB and was unable as well as fearful of falling. Max A for BLE's off front of bed and max A for elevation of trunk into sitting. Pt also needed max A to return to supine    Transfers Overall transfer level: Needs  assistance Equipment used: Rolling walker (2 wheeled) Transfers: Sit to/from Stand;Lateral/Scoot Transfers Sit to Stand: Mod assist        Lateral/Scoot Transfers: Mod assist General transfer comment: pt needed mod A for power up. Could tolerate standing only 1 min before needing to sit. Unable to step feet in standing. Scooted in sitting along EOB to L with mod A, 3'  Ambulation/Gait             General Gait Details: unable  Stairs            Wheelchair Mobility    Modified Rankin (Stroke Patients Only)       Balance Overall balance assessment: Needs assistance;History of Falls Sitting-balance support: Bilateral upper extremity supported;Feet supported Sitting balance-Leahy Scale: Fair Sitting balance - Comments: occasional posterior lean with self correction   Standing balance support: Bilateral upper extremity supported;During functional activity Standing balance-Leahy Scale: Poor Standing balance comment: heavily reliant on UE and external support                             Pertinent Vitals/Pain Pain Assessment: Faces Faces Pain Scale: Hurts little more Pain Location: R lower leg Pain Descriptors / Indicators: Sore Pain Intervention(s): Limited activity within patient's tolerance;Monitored during session    Home Living Family/patient expects to be discharged to:: Skilled nursing facility                      Prior Function  Level of Independence: Needs assistance   Gait / Transfers Assistance Needed: pt reports that she has been able to ambulate with a rollator but question how recently this was  ADL's / Homemaking Assistance Needed: assist needed  Comments: dementia     Hand Dominance        Extremity/Trunk Assessment   Upper Extremity Assessment Upper Extremity Assessment: Generalized weakness    Lower Extremity Assessment Lower Extremity Assessment: Generalized weakness    Cervical / Trunk Assessment Cervical /  Trunk Assessment: Kyphotic  Communication   Communication: No difficulties  Cognition Arousal/Alertness: Awake/alert Behavior During Therapy: WFL for tasks assessed/performed Overall Cognitive Status: No family/caregiver present to determine baseline cognitive functioning Area of Impairment: Memory;Problem solving                     Memory: Decreased short-term memory       Problem Solving: Slow processing;Decreased initiation;Difficulty sequencing;Requires verbal cues;Requires tactile cues General Comments: pt is a poor historian but does have awareness of this      General Comments General comments (skin integrity, edema, etc.): assisted pt in setting up for lunch    Exercises     Assessment/Plan    PT Assessment Patient needs continued PT services  PT Problem List Decreased strength;Decreased activity tolerance;Decreased balance;Decreased mobility;Decreased coordination;Decreased cognition;Decreased knowledge of use of DME;Decreased safety awareness;Decreased knowledge of precautions;Pain       PT Treatment Interventions DME instruction;Gait training;Functional mobility training;Therapeutic activities;Therapeutic exercise;Balance training;Cognitive remediation    PT Goals (Current goals can be found in the Care Plan section)  Acute Rehab PT Goals Patient Stated Goal: return home PT Goal Formulation: With patient Time For Goal Achievement: 06/05/20 Potential to Achieve Goals: Fair    Frequency Min 2X/week   Barriers to discharge        Co-evaluation               AM-PAC PT "6 Clicks" Mobility  Outcome Measure Help needed turning from your back to your side while in a flat bed without using bedrails?: A Lot Help needed moving from lying on your back to sitting on the side of a flat bed without using bedrails?: A Lot Help needed moving to and from a bed to a chair (including a wheelchair)?: A Lot Help needed standing up from a chair using your arms  (e.g., wheelchair or bedside chair)?: A Lot Help needed to walk in hospital room?: Total Help needed climbing 3-5 steps with a railing? : Total 6 Click Score: 10    End of Session   Activity Tolerance: Patient limited by fatigue Patient left: in bed;with call bell/phone within reach;with bed alarm set Nurse Communication: Mobility status PT Visit Diagnosis: Unsteadiness on feet (R26.81);Muscle weakness (generalized) (M62.81);History of falling (Z91.81);Difficulty in walking, not elsewhere classified (R26.2);Pain Pain - Right/Left: Right Pain - part of body: Leg    Time: 4580-9983 PT Time Calculation (min) (ACUTE ONLY): 22 min   Charges:   PT Evaluation $PT Eval Moderate Complexity: 1 Mod          Lyanne Co, PT  Acute Rehab Services  Pager [phone] Office [phone]   Lawana Chambers Catelin Manthe 05/22/2020, 3:31 PM

## 2020-05-23 ENCOUNTER — Inpatient Hospital Stay (HOSPITAL_COMMUNITY): Payer: Medicare Other

## 2020-05-23 DIAGNOSIS — I340 Nonrheumatic mitral (valve) insufficiency: Secondary | ICD-10-CM | POA: Diagnosis not present

## 2020-05-23 DIAGNOSIS — I4891 Unspecified atrial fibrillation: Secondary | ICD-10-CM

## 2020-05-23 DIAGNOSIS — F0390 Unspecified dementia without behavioral disturbance: Secondary | ICD-10-CM | POA: Diagnosis not present

## 2020-05-23 DIAGNOSIS — E039 Hypothyroidism, unspecified: Secondary | ICD-10-CM | POA: Diagnosis not present

## 2020-05-23 DIAGNOSIS — R4182 Altered mental status, unspecified: Secondary | ICD-10-CM | POA: Diagnosis not present

## 2020-05-23 DIAGNOSIS — I361 Nonrheumatic tricuspid (valve) insufficiency: Secondary | ICD-10-CM

## 2020-05-23 DIAGNOSIS — I10 Essential (primary) hypertension: Secondary | ICD-10-CM | POA: Diagnosis not present

## 2020-05-23 LAB — COMPREHENSIVE METABOLIC PANEL
ALT: 14 U/L (ref 0–44)
AST: 17 U/L (ref 15–41)
Albumin: 2.7 g/dL — ABNORMAL LOW (ref 3.5–5.0)
Alkaline Phosphatase: 68 U/L (ref 38–126)
Anion gap: 10 (ref 5–15)
BUN: 12 mg/dL (ref 8–23)
CO2: 22 mmol/L (ref 22–32)
Calcium: 8.5 mg/dL — ABNORMAL LOW (ref 8.9–10.3)
Chloride: 107 mmol/L (ref 98–111)
Creatinine, Ser: 0.9 mg/dL (ref 0.44–1.00)
GFR, Estimated: >60 mL/min (ref >60)
Glucose, Bld: 133 mg/dL — ABNORMAL HIGH (ref 70–99)
Potassium: 3.9 mmol/L (ref 3.5–5.1)
Sodium: 139 mmol/L (ref 135–145)
Total Bilirubin: 0.9 mg/dL (ref 0.3–1.2)
Total Protein: 5.5 g/dL — ABNORMAL LOW (ref 6.5–8.1)

## 2020-05-23 LAB — CBC
HCT: 35.5 % — ABNORMAL LOW (ref 36.0–46.0)
Hemoglobin: 11.5 g/dL — ABNORMAL LOW (ref 12.0–15.0)
MCH: 28 pg (ref 26.0–34.0)
MCHC: 32.4 g/dL (ref 30.0–36.0)
MCV: 86.4 fL (ref 80.0–100.0)
Platelets: 216 10*3/uL (ref 150–400)
RBC: 4.11 MIL/uL (ref 3.87–5.11)
RDW: 15.1 % (ref 11.5–15.5)
WBC: 5.1 10*3/uL (ref 4.0–10.5)
nRBC: 0 % (ref 0.0–0.2)

## 2020-05-23 LAB — MAGNESIUM: Magnesium: 1.6 mg/dL — ABNORMAL LOW (ref 1.7–2.4)

## 2020-05-23 LAB — SARS CORONAVIRUS 2 (TAT 6-24 HRS): SARS Coronavirus 2: NEGATIVE

## 2020-05-23 LAB — TSH: TSH: 1.711 u[IU]/mL (ref 0.350–4.500)

## 2020-05-23 LAB — ECHOCARDIOGRAM COMPLETE
Height: 62 in
S' Lateral: 2.8 cm
Weight: 2578.5 oz

## 2020-05-23 LAB — PHOSPHORUS: Phosphorus: 2.7 mg/dL (ref 2.5–4.6)

## 2020-05-23 MED ORDER — LEVETIRACETAM 750 MG PO TABS
750.0000 mg | ORAL_TABLET | Freq: Two times a day (BID) | ORAL | Status: DC
  Administered 2020-05-23 – 2020-05-24 (×3): 750 mg via ORAL
  Filled 2020-05-23 (×3): qty 1

## 2020-05-23 MED ORDER — MAGNESIUM OXIDE 400 (241.3 MG) MG PO TABS
400.0000 mg | ORAL_TABLET | Freq: Two times a day (BID) | ORAL | Status: DC
  Administered 2020-05-23 – 2020-05-24 (×3): 400 mg via ORAL
  Filled 2020-05-23 (×3): qty 1

## 2020-05-23 MED ORDER — MAGNESIUM SULFATE 2 GM/50ML IV SOLN
2.0000 g | Freq: Once | INTRAVENOUS | Status: DC
  Filled 2020-05-23: qty 50

## 2020-05-23 MED ORDER — DONEPEZIL HCL 10 MG PO TABS
10.0000 mg | ORAL_TABLET | Freq: Every day | ORAL | Status: DC
  Administered 2020-05-23 – 2020-05-24 (×2): 10 mg via ORAL
  Filled 2020-05-23 (×2): qty 1

## 2020-05-23 MED ORDER — ASPIRIN EC 81 MG PO TBEC
81.0000 mg | DELAYED_RELEASE_TABLET | Freq: Every day | ORAL | Status: DC
  Administered 2020-05-23 – 2020-05-24 (×2): 81 mg via ORAL
  Filled 2020-05-23 (×2): qty 1

## 2020-05-23 MED ORDER — GABAPENTIN 300 MG PO CAPS
300.0000 mg | ORAL_CAPSULE | Freq: Three times a day (TID) | ORAL | Status: DC
  Administered 2020-05-23 – 2020-05-24 (×3): 300 mg via ORAL
  Filled 2020-05-23 (×3): qty 1

## 2020-05-23 NOTE — Plan of Care (Signed)
  Problem: Clinical Measurements: Goal: Respiratory complications will improve Outcome: Progressing   Problem: Activity: Goal: Risk for activity intolerance will decrease Outcome: Progressing   Problem: Coping: Goal: Level of anxiety will decrease Outcome: Progressing   

## 2020-05-23 NOTE — Progress Notes (Addendum)
PROGRESS NOTE  Heidi Bean WER:[id number] DOB: 04-06-1930 DOA: 05/21/2020 PCP: Renford Dills, MD   LOS: 2 days   Brief narrative: Heidi Bean is 87 years old female with history of dementia, hypertension, seizure disorder paroxysmal atrial fibrillation was sent from skilled nursing facility with worsening confusion.  She did have a mechanical fall 2 days back and hit her head.  She was seen in the ED after a fall and everything was okay so she was sent back to the hospital but since discharge she has been more confused.  Repeat CT scan of the head was negative for acute findings.  WBC within normal limits.  Patient did have mild acute kidney injury with creatinine elevated at 1.6 compared to 1.0 one month back.  The patient was admitted to hospital for altered mental status, acute kidney injury.  Assessment/Plan:  Active Problems:   AMS (altered mental status)   Dementia (HCC)   Essential hypertension   Hypothyroidism  Altered mental status likely acute metabolic encephalopathy. -Significantly improved at this time.  Could be secondary to fall postictal state.  Continue Keppra.  Neurology was consulted.  MRI does not show any acute findings.2D echocardiogram pending.  Blood cultures pending.  Urine culture with multiple species likely contamination..  Chest x-ray without acute findings.  Empirically received a vitamin B12 injection.  Dose of gabapentin has been decreased.  Urine drug screen was negative.  TSH of 1.2.  Ammonia was 29.  No leukocytosis noted.  Falls.  I spoke with the patient's daughter at length about it.  She states that that she has been falling quite a bit and is a very high risk of falling.  She does not feel comfortable about starting the patient on anticoagulation.  I have discussed the risk versus benefits of anticoagulation including stroke risk and massive bleeding if falls and trauma.  Patient daughter is not comfortable with anticoagulation at this time.  Elevated  lactate.    Could be secondary to recent seizures/volume depletion.  Continue hydration.  Repeat lactate was normal at 1.9.  Acute kidney injury. Patient was clinically volume depleted.  Improved with hydration at this time.  Lab Results  Component Value Date   CREATININE 0.90 05/23/2020   CREATININE 1.21 (H) 05/22/2020   CREATININE 1.50 (H) 05/21/2020    Paroxysmal atrial fibrillation Had rapid ventricular response yesterday.  Patient required metoprolol IV and PO.  Currently on p.o. metoprolol.  Rate has controlled.  Not on anticoagulation as outpatient.  Spoke with the patient's daughter about it.  Patient has frequent falls at the skilled nursing facility and the patient's daughter is not comfortable on initiating Eliquis.  Will resume aspirin orally.  Risk versus benefits of anticoagulation were thoroughly explained.  Hypotension Resolved at this time.  Received IV fluids.    Seizure disorder Change Keppra to oral at this time.  Will change gabapentin to 3 times daily.  History of dementia Resume Aricept  Hypokalemia.  Has normalized after replacement.  Check BMP in AM.  Hypomagnesemia mild.  Will replace with magnesium IV and orally..  DVT prophylaxis: heparin injection 5,000 Units Start: 05/21/20 2200  Code Status: Full code  Family Communication:   I spoke with the patient's daughter on the phone today.  Answered all the queries she had.  Status is: Inpatient  Remains inpatient appropriate because: IV treatments appropriate due to intensity of illness or inability to take PO and Inpatient level of care appropriate due to severity of illness   Dispo: The  patient is from: Skilled nursing facility              Anticipated d/c is to: Skilled nursing facility              Patient currently is not medically stable to d/c.  Anticipated date of discharge.  1 to 2 days, will get PT evaluation, check 2D echocardiogram.  Continue to monitor rhythm   Difficult to place  patient No  Consultants:  Neurology  Procedures:  EEG  Anti-infectives:  . None  Anti-infectives (From admission, onward)   None      Subjective: Today, she was seen and examined at bedside.  Patient is much more alert awake communicative.  Wants to eat.  Wants to go back to her place.  States that she could not sleep or eat yesterday.  Objective: Vitals:   05/23/20 0400 05/23/20 0800  BP: (!) 130/92   Pulse:    Resp: 20 (!) 21  Temp: 98.8 F (37.1 C)   SpO2: 97%     Intake/Output Summary (Last 24 hours) at 05/23/2020 1009 Last data filed at 05/23/2020 0800 Gross per 24 hour  Intake 3262.07 ml  Output 1300 ml  Net 1962.07 ml   Filed Weights   05/22/20 0037  Weight: 73.1 kg   Body mass index is 29.48 kg/m.   Physical Exam: GENERAL: Patient is alert awake and communicative, oriented to time place.  Not in obvious distress. HENT: No scleral pallor or icterus. Pupils equally reactive to light. Oral mucosa is dry NECK: is supple, no gross swelling noted. CHEST: Clear to auscultation. No crackles or wheezes.  Diminished breath sounds bilaterally. CVS: S1 and S2 heard, no murmur. Regular rate and rhythm.  ABDOMEN: Soft, non-tender, bowel sounds are present. EXTREMITIES: No edema. CNS: Sleepy but arousable, answering questions, moving all extremities SKIN: warm and dry without rashes.  Data Review: I have personally reviewed the following laboratory data and studies,  CBC: Recent Labs  Lab 05/21/20 1325 05/21/20 1332 05/22/20 0242 05/23/20 0222  WBC 7.6  --  5.5 5.1  NEUTROABS 5.6  --  3.5  --   HGB 11.9* 11.9* 11.5* 11.5*  HCT 37.7 35.0* 35.9* 35.5*  MCV 87.9  --  88.2 86.4  PLT 202  --  199 216   Basic Metabolic Panel: Recent Labs  Lab 05/21/20 1325 05/21/20 1332 05/22/20 0242 05/23/20 0222  NA 137 139 136 139  K 3.5 3.5 2.9* 3.9  CL 105 105 107 107  CO2 22  --  20* 22  GLUCOSE 103* 94 197* 133*  BUN 13 14 11 12   CREATININE 1.60* 1.50*  1.21* 0.90  CALCIUM 8.6*  --  8.1* 8.5*  MG  --   --   --  1.6*  PHOS  --   --   --  2.7   Liver Function Tests: Recent Labs  Lab 05/21/20 1325 05/23/20 0222  AST 23 17  ALT 18 14  ALKPHOS 69 68  BILITOT 1.3* 0.9  PROT 5.9* 5.5*  ALBUMIN 3.1* 2.7*   No results for input(s): LIPASE, AMYLASE in the last 168 hours. Recent Labs  Lab 05/21/20 1410  AMMONIA 29   Cardiac Enzymes: No results for input(s): CKTOTAL, CKMB, CKMBINDEX, TROPONINI in the last 168 hours. BNP (last 3 results) No results for input(s): BNP in the last 8760 hours.  ProBNP (last 3 results) No results for input(s): PROBNP in the last 8760 hours.  CBG: Recent Labs  Lab 05/21/20  1324  GLUCAP 87   Recent Results (from the past 240 hour(s))  Resp Panel by RT-PCR (Flu A&B, Covid) Nasopharyngeal Swab     Status: None   Collection Time: 05/21/20  1:24 PM   Specimen: Nasopharyngeal Swab; Nasopharyngeal(NP) swabs in vial transport medium  Result Value Ref Range Status   SARS Coronavirus 2 by RT PCR NEGATIVE NEGATIVE Final    Comment: (NOTE) SARS-CoV-2 target nucleic acids are NOT DETECTED.  The SARS-CoV-2 RNA is generally detectable in upper respiratory specimens during the acute phase of infection. The lowest concentration of SARS-CoV-2 viral copies this assay can detect is 138 copies/mL. A negative result does not preclude SARS-Cov-2 infection and should not be used as the sole basis for treatment or other patient management decisions. A negative result may occur with  improper specimen collection/handling, submission of specimen other than nasopharyngeal swab, presence of viral mutation(s) within the areas targeted by this assay, and inadequate number of viral copies(<138 copies/mL). A negative result must be combined with clinical observations, patient history, and epidemiological information. The expected result is Negative.  Fact Sheet for Patients:   BloggerCourse.com  Fact Sheet for Healthcare Providers:  SeriousBroker.it  This test is no t yet approved or cleared by the [place] FDA and  has been authorized for detection and/or diagnosis of SARS-CoV-2 by FDA under an Emergency Use Authorization (EUA). This EUA will remain  in effect (meaning this test can be used) for the duration of the COVID-19 declaration under Section 564(b)(1) of the Act, 21 U.S.C.section 360bbb-3(b)(1), unless the authorization is terminated  or revoked sooner.       Influenza A by PCR NEGATIVE NEGATIVE Final   Influenza B by PCR NEGATIVE NEGATIVE Final    Comment: (NOTE) The Xpert Xpress SARS-CoV-2/FLU/RSV plus assay is intended as an aid in the diagnosis of influenza from Nasopharyngeal swab specimens and should not be used as a sole basis for treatment. Nasal washings and aspirates are unacceptable for Xpert Xpress SARS-CoV-2/FLU/RSV testing.  Fact Sheet for Patients: BloggerCourse.com  Fact Sheet for Healthcare Providers: SeriousBroker.it  This test is not yet approved or cleared by the [place] FDA and has been authorized for detection and/or diagnosis of SARS-CoV-2 by FDA under an Emergency Use Authorization (EUA). This EUA will remain in effect (meaning this test can be used) for the duration of the COVID-19 declaration under Section 564(b)(1) of the Act, 21 U.S.C. section 360bbb-3(b)(1), unless the authorization is terminated or revoked.  Performed at [facility], [address], [place], [place] [address]      Studies: DG Chest 1 View  Result Date: 05/22/2020 CLINICAL DATA:  Aspiration pneumonia. EXAM: CHEST  1 VIEW COMPARISON:  Chest radiograph May 18, 2020. FINDINGS: Enlarged cardiac silhouette, unchanged. No focal consolidation. No significant pleural effusion or visible pneumothorax. The visualized skeletal  structures are unchanged. IMPRESSION: No acute cardiopulmonary disease. Electronically Signed   By: Maudry Mayhew MD   On: 05/22/2020 13:25   MR BRAIN WO CONTRAST  Result Date: 05/22/2020 CLINICAL DATA:  Neuro deficit, acute, stroke suspected EXAM: MRI HEAD WITHOUT CONTRAST TECHNIQUE: Multiplanar, multiecho pulse sequences of the brain and surrounding structures were obtained without intravenous contrast. COMPARISON:  05/21/2020 and prior. FINDINGS: Brain: No diffusion-weighted signal abnormality. No intracranial hemorrhage. No midline shift, ventriculomegaly or extra-axial fluid collection. No mass lesion. Mild cerebral atrophy with ex vacuo dilatation. Asymmetric mild right hippocampal atrophy. Moderate chronic microvascular ischemic changes. Vascular: Normal flow voids. Skull and upper cervical spine:  Normal marrow signal. Sinuses/Orbits: Sequela of bilateral lens replacement. Clear paranasal sinuses and mastoid air cells. Other: None. IMPRESSION: No acute intracranial process.  Mild right hippocampal atrophy. Mild cerebral atrophy and moderate chronic microvascular ischemic changes. Electronically Signed   By: Stana Buntinghikanele  Emekauwa M.D.   On: 05/22/2020 14:01   EEG adult  Result Date: 05/21/2020 Charlsie QuestYadav, Priyanka O, MD     05/21/2020  3:47 PM Patient Name: Heidi FastBobbie Bean MRN: [id number] Epilepsy Attending: Charlsie QuestPriyanka O Yadav Referring Physician/Provider: Date: 05/21/2020 Duration: 32.04 mins Patient history: 87yo F with h/o seizure who presented with ams, unequal pupils. EEG to evaluate for seizure Level of alertness: Awake AEDs during EEG study: LEV Technical aspects: This EEG study was done with scalp electrodes positioned according to the 10-20 International system of electrode placement. Electrical activity was acquired at a sampling rate of 500Hz  and reviewed with a high frequency filter of 70Hz  and a low frequency filter of 1Hz . EEG data were recorded continuously and digitally stored. Description: The  posterior dominant rhythm consists of 8 Hz activity of moderate voltage (25-35 uV) seen predominantly in posterior head regions, symmetric and reactive to eye opening and eye closing. EEG showed intermittent generalized 3 to 6 Hz theta-delta slowing.  Hyperventilation and photic stimulation were not performed.   ABNORMALITY -Intermittent slow, generalized IMPRESSION: This study is suggestive of mild diffuse encephalopathy, nonspecific etiology. No seizures or epileptiform discharges were seen throughout the recording. Charlsie QuestPriyanka O Yadav   CT HEAD CODE STROKE WO CONTRAST  Result Date: 05/21/2020 CLINICAL DATA:  Code stroke. EXAM: CT HEAD WITHOUT CONTRAST TECHNIQUE: Contiguous axial images were obtained from the base of the skull through the vertex without intravenous contrast. COMPARISON:  05/18/2020 FINDINGS: Brain: No acute intracranial hemorrhage, mass effect, or edema. No new loss of gray-white differentiation. Patchy and confluent areas of hypoattenuation in the supratentorial white matter are nonspecific but probably reflect stable chronic microvascular ischemic changes. Prominence of the ventricles and sulci reflects generalized parenchymal volume loss. Disproportionate anteromedial temporal volume loss. Vascular: No hyperdense vessel. There is intracranial atherosclerotic calcification at the skull base. Skull: Unremarkable. Sinuses/Orbits: No acute abnormality. Other: Mastoid air cells are clear. ASPECTS ([place] Stroke Program Early CT Score) - Ganglionic level infarction (caudate, lentiform nuclei, internal capsule, insula, M1-M3 cortex): 7 - Supraganglionic infarction (M4-M6 cortex): 3 Total score (0-10 with 10 being normal): 10 IMPRESSION: There is no acute intracranial hemorrhage or evidence of acute infarction. ASPECT score is 10. Stable chronic findings detailed above. These results were communicated to Dr. Wilford CornerArora at 1:42pm on 05/21/2020 by text page via the [facility] messaging system. Electronically  Signed   By: Guadlupe SpanishPraneil  Patel M.D.   On: 05/21/2020 13:44      Joycelyn DasLaxman Jery Hollern, MD  [facility] 05/23/2020  If 7PM-7AM, please contact night-coverage

## 2020-05-23 NOTE — Progress Notes (Addendum)
Neurology Progress Note Subjective: Confusion and agitation noted by nursing overnight, Ativan held per son's request.  Tachycardia noted overnight with heart rate into the 160's.  Patient with much improvement in mental status on examination this morning. She is sitting up in bed, greets examiner upon entering. She does not recall the past few days in the hospital, she is pleasantly confused and in no acute distress. Blood pressure improved today at 130/92; she was hypotensive yesterday requiring IV fluid boluses.  Exam: Vitals:   05/23/20 0400 05/23/20 0800  BP: (!) 130/92   Pulse:    Resp: 20 (!) 21  Temp: 98.8 F (37.1 C)   SpO2: 97%    Gen: In bed, awake, alert, in no acute distress.  Resp: non-labored breathing, no acute distress Abd: soft, non-tender, non-distended  Neuro: MS: awake, alert, refers to self in first person. Able to state name, initially states the year is [place] but quickly corrects to 2022. Alert to month. Not oriented to place or situation but does state that she gets confused. Speech is clear without dysarthria or aphasia. Follows commands. CN: Tracks examiner around the room, pupils are unequal at baseline with right pupil 5 mm, irregular, and non-reactive- surgical pupil, left pupil is round and reactive to light, face is symmetric, hearing is intact, phonation is normal, tongue protrudes midline, shoulder shrug intact and equal.  Motor: 4/5 throughout with antigravity and spontaneous movement against gravity. Left upper extremity with limited assessment due to complaints of left neck pain and stiffness.  Sensory: Intact and symmetric to light touch in upper and lower extremities.   Pertinent Labs: CBC    Component Value Date/Time   WBC 5.1 05/23/2020 0222   RBC 4.11 05/23/2020 0222   HGB 11.5 (L) 05/23/2020 0222   HCT 35.5 (L) 05/23/2020 0222   PLT 216 05/23/2020 0222   MCV 86.4 05/23/2020 0222   MCH 28.0 05/23/2020 0222   MCHC 32.4 05/23/2020 0222    RDW 15.1 05/23/2020 0222   LYMPHSABS 1.2 05/22/2020 0242   MONOABS 0.4 05/22/2020 0242   EOSABS 0.3 05/22/2020 0242   BASOSABS 0.1 05/22/2020 0242   CMP     Component Value Date/Time   NA 139 05/23/2020 0222   K 3.9 05/23/2020 0222   CL 107 05/23/2020 0222   CO2 22 05/23/2020 0222   GLUCOSE 133 (H) 05/23/2020 0222   BUN 12 05/23/2020 0222   CREATININE 0.90 05/23/2020 0222   CALCIUM 8.5 (L) 05/23/2020 0222   PROT 5.5 (L) 05/23/2020 0222   ALBUMIN 2.7 (L) 05/23/2020 0222   AST 17 05/23/2020 0222   ALT 14 05/23/2020 0222   ALKPHOS 68 05/23/2020 0222   BILITOT 0.9 05/23/2020 0222   GFRNONAA >60 05/23/2020 0222   Urinalysis    Component Value Date/Time   COLORURINE YELLOW 05/21/2020 1755   APPEARANCEUR CLEAR 05/21/2020 1755   LABSPEC 1.010 05/21/2020 1755   PHURINE 5.0 05/21/2020 1755   GLUCOSEU NEGATIVE 05/21/2020 1755   HGBUR NEGATIVE 05/21/2020 1755   BILIRUBINUR NEGATIVE 05/21/2020 1755   KETONESUR NEGATIVE 05/21/2020 1755   PROTEINUR NEGATIVE 05/21/2020 1755   NITRITE NEGATIVE 05/21/2020 1755   LEUKOCYTESUR NEGATIVE 05/21/2020 1755   Drugs of Abuse     Component Value Date/Time   LABOPIA NONE DETECTED 05/21/2020 1755   COCAINSCRNUR NONE DETECTED 05/21/2020 1755   LABBENZ NONE DETECTED 05/21/2020 1755   AMPHETMU NONE DETECTED 05/21/2020 1755   THCU NONE DETECTED 05/21/2020 1755   LABBARB NONE DETECTED 05/21/2020 1755  Lab Results  Component Value Date   TSH 1.276 05/21/2020   Lab Results  Component Value Date   VITAMINB12 234 05/21/2020   Imaging: Dx Chest 2/26: IMPRESSION: No active cardiopulmonary disease.  CT head 2/25: There is no acute intracranial hemorrhage or evidence of acute infarction. ASPECT score is 10. Stable chronic findings detailed above.  EEG IMPRESSION: "This study is suggestive of mild diffuse encephalopathy, nonspecific etiology.No seizures or epileptiform discharges were seen throughout the recording."  MRI brain:  No  acute intracranial process.  Mild right hippocampal atrophy. Mild cerebral atrophy and moderate chronic microvascular ischemic changes.  Echocardiogram:  1. Left ventricular ejection fraction, by estimation, is 55 to 60%. The  left ventricle has normal function. The left ventricle has no regional  wall motion abnormalities. There is moderate left ventricular hypertrophy.  Left ventricular diastolic function could not be evaluated.  2. Right ventricular systolic function is normal. The right ventricular size is normal. Tricuspid regurgitation signal is inadequate for assessing  PA pressure.  3. Left atrial size was mildly dilated.  4. The mitral valve is normal in structure. Mild mitral valve  regurgitation. No evidence of mitral stenosis.  5. The aortic valve is tricuspid. Aortic valve regurgitation is not  visualized. No aortic stenosis is present.  6. The inferior vena cava is normal in size with greater than 50% respiratory variability, suggesting right atrial pressure of 3 mmHg.  Assessment:87 year old past history of dementia with behavioral disturbance, seizure disorder on Keppra, depression, hypertension, progressive decline in her cognition and mentation, brought in for altered mental status of facility where she lives in. No witnessed seizure.  Initial call for code stroke was due to unequal pupils but she has baseline asymmetric pupils-right pupil is surgical. She remained p encephalopathic yesterday as well, with right fluctuation in her blood pressure.  Had increased lactate-concern for seizure with postictal state versus infection versus an underlying cardiac etiology. Today her exam seems to be baseline-possibly also like bleed due to better rate control and blood pressure control.  Unwitnessed seizure with prolonged postictal state could have been a very reasonable explanation.  Impression: Unwitnessed seizure followed by postictal state Toxic metabolic  encephalopathy Underlying infection/sepsis; patient afebrile and without leukocytosis Low suspicion for stroke  Recommendations: - Blood cultures pending - Vitamin B12 supplementation-parenteral followed by oral - Continue to stabilize blood pressure, atrial fibrillation per primary team - Continue inpatient seizure precautions - Minimize sedating medications at this time. -Decrease Gabapentin to 300 BID. OK to keep Lexapro at current dose since it is outpatient med.  - Keppra 750 mg IV BID - Ativan PRN for any seizure lasting > 5 minutes - Continue to optimize patient, neurology will be available for further questions on an as needed basis   Also it is unclear why she is not on anticoagulation and only aspirin for atrial fibrillation.  I would recommend primary team follow-up with cardiology and have a plan.  Lanae Boast, AGACNP-BC [facility] [phone]  Attending Neurohospitalist Addendum Patient seen and examined with APP/Resident. Agree with the history and physical as documented above. Agree with the plan as documented, which I helped formulate. I have independently reviewed the chart, obtained history, review of systems and examined the patient.I have personally reviewed pertinent head/neck/spine imaging (CT/MRI). Please feel free to call with any questions.  -- Milon Dikes, MD Neurologist [facility] Pager: [phone]

## 2020-05-23 NOTE — Progress Notes (Signed)
  Echocardiogram 2D Echocardiogram has been performed.  Tye Savoy 05/23/2020, 9:14 AM

## 2020-05-23 NOTE — TOC Initial Note (Signed)
Transition of Care [facility]) - Initial/Assessment Note    Patient Details  Name: Heidi Bean MRN: [id number] Date of Birth: 04/02/1930  [facility]) CM/SW Contact:    Carley Hammed, LCSWA Phone Number: 05/23/2020, 10:51 AM  Clinical Narrative:                 Pt is from long term care at [facility] in the memory care unit. CSW spoke with pt's daughter, Trula Ore, who noted that pt had been there since "around Thanksgiving". She noted that the plan is for the pt to return to [facility] at discharge. SW will need to follow up with [facility] during the week. Pt is fully vaccinated. Sw will continue to follow.   Expected Discharge Plan: Long Term Nursing Home Barriers to Discharge: Insurance Authorization,Continued Medical Work up   Patient Goals and CMS Choice Patient states their goals for this hospitalization and ongoing recovery are:: Pt unable to participate in discharge planning due to disorientation.   Choice offered to / list presented to : Adult Children  Expected Discharge Plan and Services Expected Discharge Plan: Long Term Nursing Home     Post Acute Care Choice: Nursing Home Living arrangements for the past 2 months: Skilled Nursing Facility                                      Prior Living Arrangements/Services Living arrangements for the past 2 months: Skilled Nursing Facility Lives with:: Facility Resident Patient language and need for interpreter reviewed:: Yes Do you feel safe going back to the place where you live?: Yes      Need for Family Participation in Patient Care: Yes (Comment) Care giver support system in place?: Yes (comment)   Criminal Activity/Legal Involvement Pertinent to Current Situation/Hospitalization: No - Comment as needed  Activities of Daily Living      Permission Sought/Granted Permission sought to share information with : Family Electrical engineer Permission granted to share information  with : Yes, Verbal Permission Granted  Share Information with NAME: Charlsie Merles  Permission granted to share info w AGENCY: [facility]  Permission granted to share info w Relationship: daughter, facility  Permission granted to share info w Contact Information: [phone]  Emotional Assessment Appearance:: Appears stated age Attitude/Demeanor/Rapport: Unable to Assess Affect (typically observed): Unable to Assess Orientation: : Oriented to Self Alcohol / Substance Use: Not Applicable Psych Involvement: No (comment)  Admission diagnosis:  Hypotension, unspecified hypotension type [I95.9] Altered mental status, unspecified altered mental status type [R41.82] AMS (altered mental status) [R41.82] Patient Active Problem List   Diagnosis Date Noted  . AMS (altered mental status) 05/21/2020  . Breakthrough seizure (HCC) 02/16/2020  . Dementia (HCC) 09/08/2017  . Essential hypertension 09/08/2017  . Hypothyroidism 09/08/2017   PCP:  Renford Dills, MD Pharmacy:  No Pharmacies Listed    Social Determinants of Health (SDOH) Interventions    Readmission Risk Interventions No flowsheet data found.

## 2020-05-23 NOTE — NC FL2 (Signed)
[place] MEDICAID FL2 LEVEL OF CARE SCREENING TOOL     IDENTIFICATION  Patient Name: Heidi Bean Birthdate: 1930/07/23 Sex: female Admission Date (Current Location): 05/21/2020  [facility] and [place] Number:  Producer, television/film/video and Address:  [facility] [place], [address], [place], [place] [address]      Provider Number: [id number]  Attending Physician Name and Address:  Joycelyn Das, MD  Relative Name and Phone Number:  Charlsie Merles, [phone]    Current Level of Care: Hospital Recommended Level of Care: Skilled Nursing Facility Prior Approval Number:    Date Approved/Denied:   PASRR Number: [id number]  Discharge Plan: SNF    Current Diagnoses: Patient Active Problem List   Diagnosis Date Noted  . AMS (altered mental status) 05/21/2020  . Breakthrough seizure (HCC) 02/16/2020  . Dementia (HCC) 09/08/2017  . Essential hypertension 09/08/2017  . Hypothyroidism 09/08/2017    Orientation RESPIRATION BLADDER Height & Weight     Self  Normal Incontinent,External catheter Weight: 161 lb 2.5 oz (73.1 kg) Height:  5\' 2"  (157.5 cm)  BEHAVIORAL SYMPTOMS/MOOD NEUROLOGICAL BOWEL NUTRITION STATUS      Incontinent Diet (See DC summary)  AMBULATORY STATUS COMMUNICATION OF NEEDS Skin   Extensive Assist Verbally Skin abrasions (Groin, leg and abdomen, with barrier cream)                       Personal Care Assistance Level of Assistance  Bathing,Feeding,Dressing Bathing Assistance: Maximum assistance Feeding assistance: Limited assistance Dressing Assistance: Maximum assistance     Functional Limitations Info  Sight,Hearing,Speech Sight Info: Adequate Hearing Info: Adequate Speech Info: Adequate    SPECIAL CARE FACTORS FREQUENCY  PT (By licensed PT),OT (By licensed OT)     PT Frequency: 5x week OT Frequency: 5x week            Contractures Contractures Info: Not present    Additional Factors Info  Code  Status,Allergies Code Status Info: Full Allergies Info: NKA           Current Medications (05/23/2020):  This is the current hospital active medication list Current Facility-Administered Medications  Medication Dose Route Frequency Provider Last Rate Last Admin  . acetaminophen (TYLENOL) tablet 650 mg  650 mg Oral Q4H PRN 05/25/2020, MD   650 mg at 05/22/20 0133  . aspirin EC tablet 81 mg  81 mg Oral Daily Pokhrel, Laxman, MD      . donepezil (ARICEPT) tablet 10 mg  10 mg Oral Daily Pokhrel, Laxman, MD   10 mg at 05/23/20 1001  . escitalopram (LEXAPRO) tablet 10 mg  10 mg Oral Daily 05/25/20 T, MD   10 mg at 05/23/20 1001  . gabapentin (NEURONTIN) capsule 300 mg  300 mg Oral TID Pokhrel, Laxman, MD      . heparin injection 5,000 Units  5,000 Units Subcutaneous Q12H 05/25/20, MD   5,000 Units at 05/23/20 1002  . levETIRAcetam (KEPPRA) tablet 750 mg  750 mg Oral BID Pokhrel, Laxman, MD      . levothyroxine (SYNTHROID) tablet 25 mcg  25 mcg Oral Daily 05/25/20 T, MD   25 mcg at 05/23/20 [phone]  . LORazepam (ATIVAN) injection 1 mg  1 mg Intravenous Q5 min PRN 7616, MD      . magnesium oxide (MAG-OX) tablet 400 mg  400 mg Oral BID Pokhrel, Laxman, MD      . magnesium sulfate IVPB 2 g 50 mL  2 g Intravenous Once Pokhrel, Laxman, MD      . melatonin tablet 5 mg  5 mg Oral QHS Mikey [employer] T, MD   5 mg at 05/22/20 2138  . metoprolol tartrate (LOPRESSOR) injection 2.5 mg  2.5 mg Intravenous Q3H PRN Pokhrel, Laxman, MD   2.5 mg at 05/22/20 1519  . metoprolol tartrate (LOPRESSOR) tablet 25 mg  25 mg Oral BID Pokhrel, Laxman, MD   25 mg at 05/23/20 1004  . nystatin (MYCOSTATIN) 100000 UNIT/ML suspension 500,000 Units  5 mL Oral QID Emeline General, MD   500,000 Units at 05/23/20 1001     Discharge Medications: Please see discharge summary for a list of discharge medications.  Relevant Imaging Results:  Relevant Lab Results:   Additional Information SS# [id number], [address]

## 2020-05-23 NOTE — Progress Notes (Signed)
   05/23/20 0800  Assess: MEWS Score  ECG Heart Rate (!) 115  Resp (!) 21  Level of Consciousness Alert  O2 Device Room Air  Assess: MEWS Score  MEWS Temp 0  MEWS Systolic 0  MEWS Pulse 2  MEWS RR 1  MEWS LOC 0  MEWS Score 3  MEWS Score Color Yellow  Assess: if the MEWS score is Yellow or Red  Were vital signs taken at a resting state? Yes  Focused Assessment No change from prior assessment  Early Detection of Sepsis Score *See Row Information* Low  MEWS guidelines implemented *See Row Information* No, previously yellow, continue vital signs every 4 hours  Treat  MEWS Interventions Other (Comment)  Pain Scale Faces  Pain Score 0  Neuro symptoms relieved by Rest  Take Vital Signs  Increase Vital Sign Frequency  Yellow: Q 2hr X 2 then Q 4hr X 2, if remains yellow, continue Q 4hrs  Notify: Charge Nurse/RN  Name of Charge Nurse/RN Notified Marylene Land  Date Charge Nurse/RN Notified 05/23/20  Time Charge Nurse/RN Notified 0830

## 2020-05-23 NOTE — Progress Notes (Signed)
Physician notified of loss of IV access and that magnesium IVPB not given.

## 2020-05-23 NOTE — Progress Notes (Signed)
Called lab to order Covid swab.  Lab instructed to order through Epic.

## 2020-05-24 DIAGNOSIS — I959 Hypotension, unspecified: Secondary | ICD-10-CM | POA: Diagnosis not present

## 2020-05-24 DIAGNOSIS — E039 Hypothyroidism, unspecified: Secondary | ICD-10-CM | POA: Diagnosis not present

## 2020-05-24 DIAGNOSIS — F0390 Unspecified dementia without behavioral disturbance: Secondary | ICD-10-CM | POA: Diagnosis not present

## 2020-05-24 DIAGNOSIS — I10 Essential (primary) hypertension: Secondary | ICD-10-CM | POA: Diagnosis not present

## 2020-05-24 MED ORDER — MAGNESIUM OXIDE 400 (241.3 MG) MG PO TABS: 400.0000 mg | ORAL_TABLET | Freq: Two times a day (BID) | ORAL | Status: AC

## 2020-05-24 MED ORDER — METOPROLOL TARTRATE 25 MG PO TABS: 25.0000 mg | ORAL_TABLET | Freq: Two times a day (BID) | ORAL | 11 refills | Status: DC

## 2020-05-24 MED ORDER — ASPIRIN 81 MG PO TBEC: 81.0000 mg | DELAYED_RELEASE_TABLET | Freq: Every day | ORAL | 11 refills | Status: DC

## 2020-05-24 NOTE — Plan of Care (Signed)

## 2020-05-24 NOTE — Care Management Important Message (Signed)
Important Message  Patient Details  Name: Heidi Bean MRN: [id number] Date of Birth: 01/05/1931   Medicare Important Message Given:  Yes   Patient left prior to IM delivery.  IM mailed to patient home address.   [name] 05/24/2020, 2:33 PM

## 2020-05-24 NOTE — TOC Transition Note (Signed)
Transition of Care [facility]) - CM/SW Discharge Note   Patient Details  Name: Heidi Bean MRN: [id number] Date of Birth: 02-24-1931  Transition [facility]) CM/SW Contact:  Baldemar Lenis, LCSW Phone Number: 05/24/2020, 10:53 AM   Clinical Narrative:   Nurse to call report to [phone], Room 224B [facility]    Final next level of care: Skilled Nursing Facility Barriers to Discharge: Barriers Resolved   Patient Goals and CMS Choice Patient states their goals for this hospitalization and ongoing recovery are:: Pt unable to participate in discharge planning due to disorientation.   Choice offered to / list presented to : Adult Children  Discharge Placement              Patient chooses bed at: [facility] Patient to be transferred to facility by: [facility] Name of family member notified: Christina Patient and family notified of of transfer: 05/24/20  Discharge Plan and Services     Post Acute Care Choice: Nursing Home                               Social Determinants of Health (SDOH) Interventions     Readmission Risk Interventions No flowsheet data found.

## 2020-05-24 NOTE — Progress Notes (Signed)
Report to SNF, and Pascal recived report on patient from this nurse. Pascal verbalized understanding of D/C information given to him.

## 2020-05-24 NOTE — Discharge Summary (Signed)
Physician Discharge Summary  Heidi Bean YNW:[id number] DOB: 12/09/1930 DOA: 05/21/2020  PCP: Renford Dills, MD  Admit date: 05/21/2020 Discharge date: 05/24/2020  Admitted From: Skilled nursing facility  Discharge disposition: Skilled nursing facility   Recommendations for Outpatient Follow-Up:   . Follow up with your primary care provider at the skilled nursing facility in 3 to 5 days . Check CBC, BMP, magnesium in the next visit . Fall precautions at the skilled nursing facility  Discharge Diagnosis:   Active Problems:   AMS (altered mental status)   Dementia (HCC)   Essential hypertension   Hypothyroidism AKI  Discharge Condition: Improved.  Diet recommendation: Low sodium, heart healthy.    Wound care: None.  Code status: Full.   History of Present Illness:   Patient is 87 years old female with history of dementia, hypertension, seizure disorder paroxysmal atrial fibrillation was sent from skilled nursing facility with worsening confusion.  She did have a mechanical fall 2 days back and hit her head.  She was seen in the ED after a fall and everything was okay so she was sent back to the hospital but since discharge she has been more confused.  Repeat CT scan of the head was negative for acute findings.  WBC within normal limits.  Patient did have mild acute kidney injury with creatinine elevated at 1.6 compared to 1.0 one month back.  The patient was admitted to hospital for altered mental status, acute kidney injury.   Hospital Course:   Following conditions were addressed during hospitalization as listed below,  Altered mental status likely acute metabolic encephalopathy. Currently at baseline.   Could be secondary to fall postictal state.  Continue Keppra at home dose.  Neurology was consulted.  MRI does not show any acute findings.2D echocardiogram with preserved LV function. Blood cultures negative in 1 day. Urine culture with multiple species likely  contamination.  Chest x-ray without acute findings.  Empirically received vitamin B12 injection.    Home medications on discharge. Urine drug screen was negative.  TSH of 1.2.  Ammonia was 29.  No leukocytosis noted.  Falls.  I spoke with the patient's daughter at length about it.  She states that that she has been falling quite a bit and is a very high risk of falling.  She does not feel comfortable about starting the patient on anticoagulation.  I have discussed the risk versus benefits of anticoagulation including stroke risk and massive bleeding if falls and trauma.  Patient daughter is not comfortable with anticoagulation at this time.  However he wishes to proceed with low-dose aspirin for the patient.  Elevated lactate.    Could be secondary to recent seizures/volume depletion.  Normalized after hydration  Acute kidney injury. Patient was clinically volume depleted.  Improved with hydration at this time.  Encourage oral intake.  Lab Results  Component Value Date   CREATININE 0.90 05/23/2020   CREATININE 1.21 (H) 05/22/2020   CREATININE 1.50 (H) 05/21/2020    Paroxysmal atrial fibrillation Had rapid ventricular response during hospitalization. Patient required metoprolol IV and PO.  Currently on p.o. metoprolol.  Rate has controlled.  Will be continued on low-dose aspirin.  Not on anticoagulation after discussion with the family. Risk versus benefits of anticoagulation were thoroughly explained.  Hypotension Resolved at this time.  Low-dose metoprolol.  Tolerating well  Seizure disorder on Keppra and gabapentin from home  History of dementia on Aricept  Hypokalemia.  Improved.  Hypomagnesemia mild.  Will continuee magnesium oxide for  next 10 days  Disposition.  At this time, patient is stable for disposition to skilled nursing facility.  Spoke with the patient's daughter on the phone yesterday.  Could not reach her today.  Medical Consultants:     Neurology  Procedures:    None Subjective:   Today, patient was seen and examined at bedside.  Denies any dizziness, lightheadedness, chest pain shortness of breath fever, feels okay, wants to get discharged.  Discharge Exam:   Vitals:   05/23/20 1929 05/24/20 0323  BP: (!) 129/59 131/73  Pulse: 60 (!) 102  Resp: 16 18  Temp: 98.1 F (36.7 C) 97.9 F (36.6 C)  SpO2: 99% 99%   Vitals:   05/23/20 0854 05/23/20 1204 05/23/20 1929 05/24/20 0323  BP: 101/71 105/65 (!) 129/59 131/73  Pulse: 96 (!) 102 60 (!) 102  Resp: Temp: 97.9 F (36.6 C) 97.9 F (36.6 C) 98.1 F (36.7 C) 97.9 F (36.6 C)  TempSrc: Oral Axillary Oral Oral  SpO2: 99%  99% 99%  Weight:      Height:       General: Alert awake, not in obvious distress HENT: pupils equally reacting to light,  No scleral pallor or icterus noted. Oral mucosa is moist.  Chest:  Clear breath sounds.  Diminished breath sounds bilaterally. No crackles or wheezes.  CVS: S1 &S2 heard. Abdomen: Soft, nontender, nondistended.  Bowel sounds are heard.   Extremities: No cyanosis, clubbing or edema.  Peripheral pulses are palpable. Psych: Alert, awake and oriented to time and place, normal mood CNS:  No cranial nerve deficits.  Power equal in all extremities.   Skin: Warm and dry.  No rashes noted.  The results of significant diagnostics from this hospitalization (including imaging, microbiology, ancillary and laboratory) are listed below for reference.     Diagnostic Studies:   DG Chest 1 View  Result Date: 05/22/2020 CLINICAL DATA:  Aspiration pneumonia. EXAM: CHEST  1 VIEW COMPARISON:  Chest radiograph May 18, 2020. FINDINGS: Enlarged cardiac silhouette, unchanged. No focal consolidation. No significant pleural effusion or visible pneumothorax. The visualized skeletal structures are unchanged. IMPRESSION: No acute cardiopulmonary disease. Electronically Signed   By: Maudry Mayhew MD   On: 05/22/2020 13:25    MR BRAIN WO CONTRAST  Result Date: 05/22/2020 CLINICAL DATA:  Neuro deficit, acute, stroke suspected EXAM: MRI HEAD WITHOUT CONTRAST TECHNIQUE: Multiplanar, multiecho pulse sequences of the brain and surrounding structures were obtained without intravenous contrast. COMPARISON:  05/21/2020 and prior. FINDINGS: Brain: No diffusion-weighted signal abnormality. No intracranial hemorrhage. No midline shift, ventriculomegaly or extra-axial fluid collection. No mass lesion. Mild cerebral atrophy with ex vacuo dilatation. Asymmetric mild right hippocampal atrophy. Moderate chronic microvascular ischemic changes. Vascular: Normal flow voids. Skull and upper cervical spine: Normal marrow signal. Sinuses/Orbits: Sequela of bilateral lens replacement. Clear paranasal sinuses and mastoid air cells. Other: None. IMPRESSION: No acute intracranial process.  Mild right hippocampal atrophy. Mild cerebral atrophy and moderate chronic microvascular ischemic changes. Electronically Signed   By: Stana Bunting M.D.   On: 05/22/2020 14:01   EEG adult  Result Date: 05/21/2020 Charlsie Quest, MD     05/21/2020  3:47 PM Patient Name: Jaycelyn Orrison MRN: [id number] Epilepsy Attending: Charlsie Quest Referring Physician/Provider: Date: 05/21/2020 Duration: 32.04 mins Patient history: 87yo F with h/o seizure who presented with ams, unequal pupils. EEG to evaluate for seizure Level of alertness: Awake AEDs during EEG study: LEV Technical aspects: This EEG study was done  with scalp electrodes positioned according to the 10-20 International system of electrode placement. Electrical activity was acquired at a sampling rate of 500Hz  and reviewed with a high frequency filter of 70Hz  and a low frequency filter of 1Hz . EEG data were recorded continuously and digitally stored. Description: The posterior dominant rhythm consists of 8 Hz activity of moderate voltage (25-35 uV) seen predominantly in posterior head regions, symmetric and  reactive to eye opening and eye closing. EEG showed intermittent generalized 3 to 6 Hz theta-delta slowing.  Hyperventilation and photic stimulation were not performed.   ABNORMALITY -Intermittent slow, generalized IMPRESSION: This study is suggestive of mild diffuse encephalopathy, nonspecific etiology. No seizures or epileptiform discharges were seen throughout the recording. Charlsie QuestPriyanka O Yadav   CT HEAD CODE STROKE WO CONTRAST  Result Date: 05/21/2020 CLINICAL DATA:  Code stroke. EXAM: CT HEAD WITHOUT CONTRAST TECHNIQUE: Contiguous axial images were obtained from the base of the skull through the vertex without intravenous contrast. COMPARISON:  05/18/2020 FINDINGS: Brain: No acute intracranial hemorrhage, mass effect, or edema. No new loss of gray-white differentiation. Patchy and confluent areas of hypoattenuation in the supratentorial white matter are nonspecific but probably reflect stable chronic microvascular ischemic changes. Prominence of the ventricles and sulci reflects generalized parenchymal volume loss. Disproportionate anteromedial temporal volume loss. Vascular: No hyperdense vessel. There is intracranial atherosclerotic calcification at the skull base. Skull: Unremarkable. Sinuses/Orbits: No acute abnormality. Other: Mastoid air cells are clear. ASPECTS (Alberta Stroke Program Early CT Score) - Ganglionic level infarction (caudate, lentiform nuclei, internal capsule, insula, M1-M3 cortex): 7 - Supraganglionic infarction (M4-M6 cortex): 3 Total score (0-10 with 10 being normal): 10 IMPRESSION: There is no acute intracranial hemorrhage or evidence of acute infarction. ASPECT score is 10. Stable chronic findings detailed above. These results were communicated to Dr. Wilford CornerArora at 1:42pm on 05/21/2020 by text page via the [facility] messaging system. Electronically Signed   By: Guadlupe SpanishPraneil  Patel M.D.   On: 05/21/2020 13:44     Labs:   Basic Metabolic Panel: Recent Labs  Lab 05/21/20 1325 05/21/20 1332  05/22/20 0242 05/23/20 0222  NA 137 139 136 139  K 3.5 3.5 2.9* 3.9  CL 105 105 107 107  CO2 22  --  20* 22  GLUCOSE 103* 94 197* 133*  BUN 13 14 11 12   CREATININE 1.60* 1.50* 1.21* 0.90  CALCIUM 8.6*  --  8.1* 8.5*  MG  --   --   --  1.6*  PHOS  --   --   --  2.7   GFR Estimated Creatinine Clearance: 41.2 mL/min (by C-G formula based on SCr of 0.9 mg/dL). Liver Function Tests: Recent Labs  Lab 05/21/20 1325 05/23/20 0222  AST 23 17  ALT 18 14  ALKPHOS 69 68  BILITOT 1.3* 0.9  PROT 5.9* 5.5*  ALBUMIN 3.1* 2.7*   No results for input(s): LIPASE, AMYLASE in the last 168 hours. Recent Labs  Lab 05/21/20 1410  AMMONIA 29   Coagulation profile Recent Labs  Lab 05/21/20 1325  INR 1.1    CBC: Recent Labs  Lab 05/21/20 1325 05/21/20 1332 05/22/20 0242 05/23/20 0222  WBC 7.6  --  5.5 5.1  NEUTROABS 5.6  --  3.5  --   HGB 11.9* 11.9* 11.5* 11.5*  HCT 37.7 35.0* 35.9* 35.5*  MCV 87.9  --  88.2 86.4  PLT 202  --  199 216   Cardiac Enzymes: No results for input(s): CKTOTAL, CKMB, CKMBINDEX, TROPONINI in the last 168 hours.  BNP: Invalid input(s): POCBNP CBG: Recent Labs  Lab 05/21/20 1324  GLUCAP 87   D-Dimer No results for input(s): DDIMER in the last 72 hours. Hgb A1c No results for input(s): HGBA1C in the last 72 hours. Lipid Profile No results for input(s): CHOL, HDL, LDLCALC, TRIG, CHOLHDL, LDLDIRECT in the last 72 hours. Thyroid function studies Recent Labs    05/23/20 1442  TSH 1.711   Anemia work up Recent Labs    05/21/20 1435  VITAMINB12 234   Microbiology Recent Results (from the past 240 hour(s))  Resp Panel by RT-PCR (Flu A&B, Covid) Nasopharyngeal Swab     Status: None   Collection Time: 05/21/20  1:24 PM   Specimen: Nasopharyngeal Swab; Nasopharyngeal(NP) swabs in vial transport medium  Result Value Ref Range Status   SARS Coronavirus 2 by RT PCR NEGATIVE NEGATIVE Final    Comment: (NOTE) SARS-CoV-2 target nucleic acids are  NOT DETECTED.  The SARS-CoV-2 RNA is generally detectable in upper respiratory specimens during the acute phase of infection. The lowest concentration of SARS-CoV-2 viral copies this assay can detect is 138 copies/mL. A negative result does not preclude SARS-Cov-2 infection and should not be used as the sole basis for treatment or other patient management decisions. A negative result may occur with  improper specimen collection/handling, submission of specimen other than nasopharyngeal swab, presence of viral mutation(s) within the areas targeted by this assay, and inadequate number of viral copies(<138 copies/mL). A negative result must be combined with clinical observations, patient history, and epidemiological information. The expected result is Negative.  Fact Sheet for Patients:  BloggerCourse.com  Fact Sheet for Healthcare Providers:  SeriousBroker.it  This test is no t yet approved or cleared by the [place] FDA and  has been authorized for detection and/or diagnosis of SARS-CoV-2 by FDA under an Emergency Use Authorization (EUA). This EUA will remain  in effect (meaning this test can be used) for the duration of the COVID-19 declaration under Section 564(b)(1) of the Act, 21 U.S.C.section 360bbb-3(b)(1), unless the authorization is terminated  or revoked sooner.       Influenza A by PCR NEGATIVE NEGATIVE Final   Influenza B by PCR NEGATIVE NEGATIVE Final    Comment: (NOTE) The Xpert Xpress SARS-CoV-2/FLU/RSV plus assay is intended as an aid in the diagnosis of influenza from Nasopharyngeal swab specimens and should not be used as a sole basis for treatment. Nasal washings and aspirates are unacceptable for Xpert Xpress SARS-CoV-2/FLU/RSV testing.  Fact Sheet for Patients: BloggerCourse.com  Fact Sheet for Healthcare Providers: SeriousBroker.it  This test is not  yet approved or cleared by the [place] FDA and has been authorized for detection and/or diagnosis of SARS-CoV-2 by FDA under an Emergency Use Authorization (EUA). This EUA will remain in effect (meaning this test can be used) for the duration of the COVID-19 declaration under Section 564(b)(1) of the Act, 21 U.S.C. section 360bbb-3(b)(1), unless the authorization is terminated or revoked.  Performed at [facility], [address] [place], [place] [address]   Culture, blood (routine x 2)     Status: None (Preliminary result)   Collection Time: 05/22/20  4:19 AM   Specimen: BLOOD  Result Value Ref Range Status   Specimen Description BLOOD LEFT ANTECUBITAL  Final   Special Requests   Final    BOTTLES DRAWN AEROBIC AND ANAEROBIC Blood Culture results may not be optimal due to an excessive volume of blood received in culture bottles   Culture  Final    NO GROWTH 1 DAY Performed at [facility], [address] [place], [place] [address]    Report Status PENDING  Incomplete  Culture, blood (routine x 2)     Status: None (Preliminary result)   Collection Time: 05/22/20  4:27 AM   Specimen: BLOOD LEFT HAND  Result Value Ref Range Status   Specimen Description BLOOD LEFT HAND  Final   Special Requests   Final    BOTTLES DRAWN AEROBIC AND ANAEROBIC Blood Culture adequate volume   Culture   Final    NO GROWTH 1 DAY Performed at [facility], [address] [place], [place] [address]    Report Status PENDING  Incomplete  SARS CORONAVIRUS 2 (TAT 6-24 HRS) Nasopharyngeal Nasopharyngeal Swab     Status: None   Collection Time: 05/23/20  6:11 PM   Specimen: Nasopharyngeal Swab  Result Value Ref Range Status   SARS Coronavirus 2 NEGATIVE NEGATIVE Final    Comment: (NOTE) SARS-CoV-2 target nucleic acids are NOT DETECTED.  The SARS-CoV-2 RNA is generally detectable in upper and lower respiratory specimens during the acute phase of infection. Negative results do  not preclude SARS-CoV-2 infection, do not rule out co-infections with other pathogens, and should not be used as the sole basis for treatment or other patient management decisions. Negative results must be combined with clinical observations, patient history, and epidemiological information. The expected result is Negative.  Fact Sheet for Patients: HairSlick.no  Fact Sheet for Healthcare Providers: quierodirigir.com  This test is not yet approved or cleared by the [place] FDA and  has been authorized for detection and/or diagnosis of SARS-CoV-2 by FDA under an Emergency Use Authorization (EUA). This EUA will remain  in effect (meaning this test can be used) for the duration of the COVID-19 declaration under Se ction 564(b)(1) of the Act, 21 U.S.C. section 360bbb-3(b)(1), unless the authorization is terminated or revoked sooner.  Performed at [facility], [address], [place], [place] [address]      Discharge Instructions:   Discharge Instructions    Diet - low sodium heart healthy   Complete by: As directed    Discharge instructions   Complete by: As directed    Follow-up with your primary care provider at the skilled nursing facility in 3 to 5 days.  Continue to take medications as prescribed.  Please take fall precautions.  Continue physical therapy at the skilled nursing facility.   Increase activity slowly   Complete by: As directed      Allergies as of 05/24/2020   No Known Allergies     Medication List    TAKE these medications   acetaminophen 325 MG tablet Commonly known as: TYLENOL Take 650 mg by mouth every 4 (four) hours as needed for mild pain, fever or headache.   aspirin 81 MG EC tablet Take 1 tablet (81 mg total) by mouth daily. Swallow whole.   calcium-vitamin D 500-200 MG-UNIT Tabs tablet Commonly known as: OSCAL WITH D Take 1 tablet by mouth daily.   donepezil 10 MG  tablet Commonly known as: ARICEPT Take 10 mg by mouth daily.   escitalopram 10 MG tablet Commonly known as: LEXAPRO Take 10 mg by mouth daily.   gabapentin 300 MG capsule Commonly known as: NEURONTIN Take 300 mg by mouth 3 (three) times daily.   hydrochlorothiazide 12.5 MG tablet Commonly known as: HYDRODIURIL Take 12.5 mg by mouth daily.   levETIRAcetam 750 MG  tablet Commonly known as: KEPPRA Take 750 mg by mouth 2 (two) times daily.   levothyroxine 25 MCG tablet Commonly known as: SYNTHROID Take 25 mcg by mouth daily.   losartan 50 MG tablet Commonly known as: COZAAR Take 50 mg by mouth daily.   magnesium oxide 400 (241.3 Mg) MG tablet Commonly known as: MAG-OX Take 1 tablet (400 mg total) by mouth 2 (two) times daily for 10 days.   megestrol 40 MG/ML suspension Commonly known as: MEGACE Take 10 mLs by mouth daily. For appetite   melatonin 5 MG Tabs Take 5 mg by mouth at bedtime.   metoprolol tartrate 25 MG tablet Commonly known as: LOPRESSOR Take 1 tablet (25 mg total) by mouth 2 (two) times daily.   nystatin 100000 UNIT/ML suspension Commonly known as: MYCOSTATIN Take 5 mLs by mouth 4 (four) times daily. SWISH AND SPIT   nystatin cream Commonly known as: MYCOSTATIN Apply to affected area 2 times daily (rash on abdomen)   sodium chloride 1 g tablet Take 1 g by mouth 3 (three) times daily.   Vitamin D 50 MCG (2000 UT) tablet Take 2,000 Units by mouth 2 (two) times daily.       Contact information for after-discharge care    Destination    [facility] .   Service: Skilled Nursing [address] information: [address] [place] [address] [phone]                   Time coordinating discharge: 39 minutes  Signed:  Sharry Beining  [facility] 05/24/2020, 10:27 AM

## 2020-05-27 LAB — CULTURE, BLOOD (ROUTINE X 2)
Culture: NO GROWTH
Culture: NO GROWTH
Special Requests: ADEQUATE

## 2021-02-13 ENCOUNTER — Encounter (HOSPITAL_COMMUNITY): Payer: Self-pay

## 2021-02-13 ENCOUNTER — Emergency Department (HOSPITAL_COMMUNITY)
Admission: EM | Admit: 2021-02-13 | Discharge: 2021-02-13 | Disposition: A | Discharge status: Skilled Nursing Facility | Payer: Medicare Other | Attending: Emergency Medicine | Admitting: Emergency Medicine

## 2021-02-13 ENCOUNTER — Emergency Department (HOSPITAL_COMMUNITY): Payer: Medicare Other

## 2021-02-13 DIAGNOSIS — S0101XA Laceration without foreign body of scalp, initial encounter: Secondary | ICD-10-CM | POA: Insufficient documentation

## 2021-02-13 DIAGNOSIS — W19XXXA Unspecified fall, initial encounter: Secondary | ICD-10-CM

## 2021-02-13 DIAGNOSIS — Y92129 Unspecified place in nursing home as the place of occurrence of the external cause: Secondary | ICD-10-CM | POA: Insufficient documentation

## 2021-02-13 DIAGNOSIS — E039 Hypothyroidism, unspecified: Secondary | ICD-10-CM | POA: Insufficient documentation

## 2021-02-13 DIAGNOSIS — I10 Essential (primary) hypertension: Secondary | ICD-10-CM | POA: Diagnosis not present

## 2021-02-13 DIAGNOSIS — F0390 Unspecified dementia without behavioral disturbance: Secondary | ICD-10-CM | POA: Diagnosis not present

## 2021-02-13 DIAGNOSIS — S0990XA Unspecified injury of head, initial encounter: Secondary | ICD-10-CM | POA: Diagnosis present

## 2021-02-13 DIAGNOSIS — Z79899 Other long term (current) drug therapy: Secondary | ICD-10-CM | POA: Insufficient documentation

## 2021-02-13 DIAGNOSIS — Z7982 Long term (current) use of aspirin: Secondary | ICD-10-CM | POA: Insufficient documentation

## 2021-02-13 DIAGNOSIS — W06XXXA Fall from bed, initial encounter: Secondary | ICD-10-CM | POA: Insufficient documentation

## 2021-02-13 MED ORDER — LIDOCAINE-EPINEPHRINE (PF) 2 %-1:200000 IJ SOLN
20.0000 mL | Freq: Once | INTRAMUSCULAR | Status: AC
  Administered 2021-02-13: 20 mL via INTRADERMAL
  Filled 2021-02-13: qty 20

## 2021-02-13 NOTE — ED Notes (Signed)
This RN attempted to call report to [facility] facility on-site nurse per pt d/c. Phone rang until disconnected.

## 2021-02-13 NOTE — ED Triage Notes (Signed)
Pt bib EMS from [facility] for fall from her bed and head lac to forehead. C/o minor head pain. Hx dementia, A&O at baseline.   EMS vitals: 130/84 HR 80 97% RR 20 CBG 151

## 2021-02-13 NOTE — ED Notes (Signed)
Patient transported to CT 

## 2021-02-13 NOTE — ED Notes (Signed)
Called ptar 

## 2021-02-13 NOTE — ED Triage Notes (Addendum)
Pt bib EMS from [facility] for fall from her bed and head lac to forehead. Takes daily aspirin. C/o minor head pain. Hx dementia, A&O at baseline.    EMS vitals: 130/84 HR 80 97% RR 20 CBG 151

## 2021-02-13 NOTE — ED Notes (Signed)
TRIAGE NOTES DONE UNDER MY NAME IN ERROR

## 2021-02-13 NOTE — Discharge Instructions (Signed)
Do not let your laceration (cut) get wet for the next 48 hours. After that you may allow soapy water to drain down the wound to clean it. Please do not scrub.  To minimize scarring, you can apply a vaseline based ointment for the next 2 weeks and keep it out of direct sun light. After that, you may apply sunscreen for the next several months. Your stitches will dissolve on their own and do not need to be removed.  Return if your wound appears to be infected (see laceration care instructions).

## 2021-02-13 NOTE — ED Provider Notes (Signed)
[facility] EMERGENCY DEPARTMENT Provider Note  CSN: [id number] Arrival date & time: 02/13/21 [id number]  Chief Complaint(s) Fall  ED Notes, ED Triage Notes, ED Provider Notes from 02/13/21 0029 to 02/13/21 02:42:11  Brown Human, RN 02/13/2021 02:18                Pt bib EMS from [facility] for fall from her bed and head lac to forehead. Takes daily aspirin. C/o minor head pain. Hx dementia, A&O at baseline.     HPI Heidi Bean is a 87 y.o. female here from s[facility] for unwitnessed fall with head trauma and facial laceration. LOC unknown. No AC.  H/o dementia. At baseline.  Remainder of history, ROS, and physical exam limited due to patient's condition (dementia). Additional information was obtained from EMS.   Level V Caveat.   HPI  Past Medical History Past Medical History:  Diagnosis Date   Dementia ([place])    Depression    HTN (hypertension)    Seizures ([place])    Patient Active Problem List   Diagnosis Date Noted   AMS (altered mental status) 05/21/2020   Breakthrough seizure ([place]) 02/16/2020   Dementia ([place]) 09/08/2017   Essential hypertension 09/08/2017   Hypothyroidism 09/08/2017   Home Medication(s) Prior to Admission medications   Medication Sig Start Date End Date Taking? Authorizing Provider  acetaminophen (TYLENOL) 325 MG tablet Take 650 mg by mouth every 4 (four) hours as needed for mild pain, fever or headache.    [provider]  aspirin EC 81 MG EC tablet Take 1 tablet (81 mg total) by mouth daily. Swallow whole. 05/24/20   Pokhrel, Corrie Mckusick, MD  calcium-vitamin D (OSCAL WITH D) 500-200 MG-UNIT TABS tablet Take 1 tablet by mouth daily.    [provider]  Cholecalciferol (VITAMIN D) 50 MCG (2000 UT) tablet Take 2,000 Units by mouth 2 (two) times daily.    [provider]  donepezil (ARICEPT) 10 MG tablet Take 10 mg by mouth daily. 12/23/19   [provider]  escitalopram (LEXAPRO) 10 MG tablet Take 10 mg  by mouth daily. 01/07/20   [provider]  gabapentin (NEURONTIN) 300 MG capsule Take 300 mg by mouth 3 (three) times daily. 01/07/20   [provider]  hydrochlorothiazide (HYDRODIURIL) 12.5 MG tablet Take 12.5 mg by mouth daily.    [provider]  levETIRAcetam (KEPPRA) 750 MG tablet Take 750 mg by mouth 2 (two) times daily. 02/19/20   [provider]  levothyroxine (SYNTHROID) 25 MCG tablet Take 25 mcg by mouth daily. 02/20/20   [provider]  losartan (COZAAR) 50 MG tablet Take 50 mg by mouth daily. 12/15/19   [provider]  megestrol (MEGACE) 40 MG/ML suspension Take 10 mLs by mouth daily. For appetite 02/20/20   [provider]  melatonin 5 MG TABS Take 5 mg by mouth at bedtime.    [provider]  metoprolol tartrate (LOPRESSOR) 25 MG tablet Take 1 tablet (25 mg total) by mouth 2 (two) times daily. 05/24/20 05/24/21  Pokhrel, Corrie Mckusick, MD  nystatin (MYCOSTATIN) 100000 UNIT/ML suspension Take 5 mLs by mouth 4 (four) times daily. SWISH AND SPIT    [provider]  nystatin cream (MYCOSTATIN) Apply to affected area 2 times daily (rash on abdomen) 04/19/20   Dorie Rank, MD  sodium chloride 1 g tablet Take 1 g by mouth 3 (three) times daily. 02/19/20   [provider]  Past Surgical History History reviewed. No pertinent surgical history. Family History No family history on file.  Social History Social History   Tobacco Use   Smoking status: Never   Smokeless tobacco: Never  Substance Use Topics   Alcohol use: Never   Drug use: Never   Allergies Patient has no known allergies.  Review of Systems Review of Systems Unable to obtain due to dementia Physical Exam Vital Signs  I have reviewed the triage vital signs BP 121/64   Pulse 61   Temp (!) 97.5 F (36.4 C)  (Oral)   Resp 16   Ht 5\' 7"  (1.702 m)   Wt 81.6 kg   SpO2 99%   BMI 28.19 kg/m   Physical Exam Constitutional:      General: She is not in acute distress.    Appearance: She is well-developed. She is not diaphoretic.  HENT:     Head: Normocephalic. Laceration present.      Right Ear: External ear normal.     Left Ear: External ear normal.     Nose: Nose normal.  Eyes:     General: No scleral icterus.       Right eye: No discharge.        Left eye: No discharge.     Conjunctiva/sclera: Conjunctivae normal.     Pupils: Pupils are equal, round, and reactive to light.  Cardiovascular:     Rate and Rhythm: Normal rate and regular rhythm.     Pulses:          Radial pulses are 2+ on the right side and 2+ on the left side.       Dorsalis pedis pulses are 2+ on the right side and 2+ on the left side.     Heart sounds: Normal heart sounds. No murmur heard.   No friction rub. No gallop.  Pulmonary:     Effort: Pulmonary effort is normal. No respiratory distress.     Breath sounds: Normal breath sounds. No stridor. No wheezing.  Abdominal:     General: There is no distension.     Palpations: Abdomen is soft.     Tenderness: There is no abdominal tenderness.  Musculoskeletal:        General: No tenderness.     Cervical back: Normal range of motion and neck supple. No bony tenderness.     Thoracic back: No bony tenderness.     Lumbar back: No bony tenderness.     Right hip: No deformity, tenderness or bony tenderness. Normal range of motion.     Left hip: No deformity, tenderness or bony tenderness. Normal range of motion.     Right knee: Normal range of motion. No tenderness.     Left knee: No tenderness.     Comments: Clavicles stable. Chest stable to AP/Lat compression. Pelvis stable to Lat compression. No obvious extremity deformity. No chest or abdominal wall contusion.  Skin:    General: Skin is warm and dry.     Findings: No erythema or rash.  Neurological:      Mental Status: She is alert and oriented to person, place, and time.     Comments: Moving all extremities    ED Results and Treatments Labs (all labs ordered are listed, but only abnormal results are displayed) Labs Reviewed - No data to display  EKG  EKG Interpretation  Date/Time:    Ventricular Rate:    PR Interval:    QRS Duration:   QT Interval:    QTC Calculation:   R Axis:     Text Interpretation:         Radiology CT Head Wo Contrast  Result Date: 02/13/2021 CLINICAL DATA:  Head trauma, neck trauma. EXAM: CT HEAD WITHOUT CONTRAST CT CERVICAL SPINE WITHOUT CONTRAST TECHNIQUE: Multidetector CT imaging of the head and cervical spine was performed following the standard protocol without intravenous contrast. Multiplanar CT image reconstructions of the cervical spine were also generated. COMPARISON:  MRI head 05/22/2020 FINDINGS: CT HEAD FINDINGS BRAIN: BRAIN Cerebral ventricle sizes are concordant with the degree of cerebral volume loss. Patchy and confluent areas of decreased attenuation are noted throughout the deep and periventricular white matter of the cerebral hemispheres bilaterally, compatible with chronic microvascular ischemic disease. No evidence of large-territorial acute infarction. No parenchymal hemorrhage. No mass lesion. No extra-axial collection. No mass effect or midline shift. No hydrocephalus. Basilar cisterns are patent. Vascular: No hyperdense vessel. Skull: No acute fracture or focal lesion. Sinuses/Orbits: Paranasal sinuses and mastoid air cells are clear. Bilateral lens replacement. Otherwise the orbits are unremarkable. Other: None. CT CERVICAL SPINE FINDINGS Alignment: Normal. Skull base and vertebrae: Multilevel severe degenerative changes of the spine. No associated severe osseous neural foraminal or central canal stenosis. No acute fracture.  No aggressive appearing focal osseous lesion or focal pathologic process. Soft tissues and spinal canal: No prevertebral fluid or swelling. No visible canal hematoma. Upper chest: Unremarkable. Other: Nuchal ligament calcifications. Poorly defined 0.9 cm left thyroid gland hypodense nodule. Not clinically significant; no follow-up imaging recommended (ref: J Am Coll Radiol. 2015 Feb;12(2): 143-50). IMPRESSION: 1. No acute intracranial abnormality. 2. No acute displaced fracture or traumatic listhesis of the cervical spine. Electronically Signed   By: Tish Frederickson M.D.   On: 02/13/2021 01:57   CT Cervical Spine Wo Contrast  Result Date: 02/13/2021 CLINICAL DATA:  Head trauma, neck trauma. EXAM: CT HEAD WITHOUT CONTRAST CT CERVICAL SPINE WITHOUT CONTRAST TECHNIQUE: Multidetector CT imaging of the head and cervical spine was performed following the standard protocol without intravenous contrast. Multiplanar CT image reconstructions of the cervical spine were also generated. COMPARISON:  MRI head 05/22/2020 FINDINGS: CT HEAD FINDINGS BRAIN: BRAIN Cerebral ventricle sizes are concordant with the degree of cerebral volume loss. Patchy and confluent areas of decreased attenuation are noted throughout the deep and periventricular white matter of the cerebral hemispheres bilaterally, compatible with chronic microvascular ischemic disease. No evidence of large-territorial acute infarction. No parenchymal hemorrhage. No mass lesion. No extra-axial collection. No mass effect or midline shift. No hydrocephalus. Basilar cisterns are patent. Vascular: No hyperdense vessel. Skull: No acute fracture or focal lesion. Sinuses/Orbits: Paranasal sinuses and mastoid air cells are clear. Bilateral lens replacement. Otherwise the orbits are unremarkable. Other: None. CT CERVICAL SPINE FINDINGS Alignment: Normal. Skull base and vertebrae: Multilevel severe degenerative changes of the spine. No associated severe osseous neural  foraminal or central canal stenosis. No acute fracture. No aggressive appearing focal osseous lesion or focal pathologic process. Soft tissues and spinal canal: No prevertebral fluid or swelling. No visible canal hematoma. Upper chest: Unremarkable. Other: Nuchal ligament calcifications. Poorly defined 0.9 cm left thyroid gland hypodense nodule. Not clinically significant; no follow-up imaging recommended (ref: J Am Coll Radiol. 2015 Feb;12(2): 143-50). IMPRESSION: 1. No acute intracranial abnormality. 2. No acute displaced fracture or traumatic listhesis of the cervical spine. Electronically Signed  By: Iven Finn M.D.   On: 02/13/2021 01:57    Pertinent labs & imaging results that were available during my care of the patient were reviewed by me and considered in my medical decision making (see MDM for details).  Medications Ordered in ED Medications  lidocaine-EPINEPHrine (XYLOCAINE W/EPI) 2 %-1:200000 (PF) injection 20 mL (20 mLs Intradermal Given by Other 02/13/21 0234)                                                                                                                                     Procedures .Marland KitchenLaceration Repair  Date/Time: 02/13/2021 2:47 AM Performed by: Fatima Blank, MD Authorized by: Fatima Blank, MD   Consent:    Consent obtained:  Emergent situation Universal protocol:    Patient identity confirmed:  Arm band Anesthesia:    Anesthesia method:  Local infiltration   Local anesthetic:  Lidocaine 2% WITH epi Laceration details:    Location:  Face   Length (cm):  3   Depth (mm):  8 Pre-procedure details:    Preparation:  Patient was prepped and draped in usual sterile fashion and imaging obtained to evaluate for foreign bodies Exploration:    Imaging outcome: foreign body not noted     Wound extent: no foreign bodies/material noted and no muscle damage noted     Contaminated: no   Treatment:    Area cleansed with:  Povidone-iodine    Amount of cleaning:  Standard   Irrigation solution:  Sterile saline   Layers/structures repaired:  Deep dermal/superficial fascia Deep dermal/superficial fascia:    Suture size:  4-0   Suture material:  Vicryl   Suture technique:  Buried horizontal mattress   Number of sutures:  1 Skin repair:    Repair method:  Sutures   Suture size:  5-0   Suture technique:  Running locked   Number of sutures:  6 Approximation:    Approximation:  Close Repair type:    Repair type:  Intermediate Post-procedure details:    Dressing:  Antibiotic ointment   Procedure completion:  Tolerated  (including critical care time)  Medical Decision Making / ED Course I have reviewed the nursing notes for this encounter and the patient's prior records (if available in EHR or on provided paperwork).  Sarabella Nakai was evaluated in Emergency Department on 02/13/2021 for the symptoms described in the history of present illness. She was evaluated in the context of the global COVID-19 pandemic, which necessitated consideration that the patient might be at risk for infection with the SARS-CoV-2 virus that causes COVID-19. Institutional protocols and algorithms that pertain to the evaluation of patients at risk for COVID-19 are in a state of rapid change based on information released by regulatory bodies including the CDC and federal and state organizations. These policies and algorithms were followed during the patient's care in the ED.     Unwitnessed fall at sNF  Face laceration irrigated and closed as above. CT head and cervical spine negative. No other signs of trauma on exam requiring imaging at this time.   Pertinent labs & imaging results that were available during my care of the patient were reviewed by me and considered in my medical decision making:    Final Clinical Impression(s) / ED Diagnoses Final diagnoses:  Fall at nursing home, initial encounter  Laceration of scalp, initial encounter   The  patient appears reasonably screened and/or stabilized for discharge and I doubt any other medical condition or other [facility] requiring further screening, evaluation, or treatment in the ED at this time prior to discharge. Safe for discharge with strict return precautions.  Disposition: Discharge  Condition: Good    ED Discharge Orders     None       Follow Up: Seward Carol, MD [address] [facility] [id number] [phone]  Call  as needed    This chart was dictated using voice recognition software.  Despite best efforts to proofread,  errors can occur which can change the documentation meaning.    Fatima Blank, MD 02/13/21 [phone]

## 2021-03-02 ENCOUNTER — Emergency Department (HOSPITAL_COMMUNITY): Payer: Medicare Other

## 2021-03-02 ENCOUNTER — Emergency Department (HOSPITAL_COMMUNITY)
Admission: EM | Admit: 2021-03-02 | Discharge: 2021-03-03 | Disposition: A | Discharge status: Skilled Nursing Facility | Payer: Medicare Other | Attending: Emergency Medicine | Admitting: Emergency Medicine

## 2021-03-02 DIAGNOSIS — S0101XA Laceration without foreign body of scalp, initial encounter: Secondary | ICD-10-CM | POA: Diagnosis not present

## 2021-03-02 DIAGNOSIS — Z7982 Long term (current) use of aspirin: Secondary | ICD-10-CM | POA: Diagnosis not present

## 2021-03-02 DIAGNOSIS — I4891 Unspecified atrial fibrillation: Secondary | ICD-10-CM | POA: Diagnosis not present

## 2021-03-02 DIAGNOSIS — W01198A Fall on same level from slipping, tripping and stumbling with subsequent striking against other object, initial encounter: Secondary | ICD-10-CM | POA: Diagnosis not present

## 2021-03-02 DIAGNOSIS — F0390 Unspecified dementia without behavioral disturbance: Secondary | ICD-10-CM | POA: Insufficient documentation

## 2021-03-02 DIAGNOSIS — W19XXXA Unspecified fall, initial encounter: Secondary | ICD-10-CM

## 2021-03-02 DIAGNOSIS — I10 Essential (primary) hypertension: Secondary | ICD-10-CM | POA: Diagnosis not present

## 2021-03-02 DIAGNOSIS — S0990XA Unspecified injury of head, initial encounter: Secondary | ICD-10-CM | POA: Diagnosis present

## 2021-03-02 DIAGNOSIS — M25552 Pain in left hip: Secondary | ICD-10-CM | POA: Diagnosis not present

## 2021-03-02 DIAGNOSIS — Z79899 Other long term (current) drug therapy: Secondary | ICD-10-CM | POA: Diagnosis not present

## 2021-03-02 MED ORDER — LIDOCAINE-EPINEPHRINE (PF) 2 %-1:200000 IJ SOLN
10.0000 mL | Freq: Once | INTRAMUSCULAR | Status: AC
  Administered 2021-03-02: 10 mL via INTRADERMAL
  Filled 2021-03-02: qty 20

## 2021-03-02 NOTE — ED Notes (Signed)
[facility] called  

## 2021-03-02 NOTE — ED Triage Notes (Signed)
Pt bib [facility] for unwitnesses fall at [facility]. No LOC, Lac to forehead, c/o Lt hip pain to the staff. Able to stand and pivot with EMS. Covid +. Pt has hx of afib, and falls. No blood thinners. C-collar in place upon arrival. A&O at baseline. VSS.

## 2021-03-02 NOTE — Discharge Instructions (Addendum)
Have sutures removed in 7-10 days at local urgent care facility or at your SNF Return to ED with any new or worsening symptoms

## 2021-03-02 NOTE — ED Provider Notes (Signed)
[facility] EMERGENCY DEPARTMENT Provider Note   CSN: [id number] Arrival date & time: 03/02/21  1906     History Chief Complaint  Patient presents with   Lytle Michaels    Heidi Bean is a 87 y.o. female  history of A. Fib, falls and dementia presents via EMS for unwitnessed ground-level fall at her SNF.  Per EMS report, no loss of consciousness, patient complaining of left hip pain to staff.  Patient able to stand and pivot with EMS.  Patient not on any blood thinners per chart review. Patient is COVID-positive.  Patient has dementia at baseline and is unable to answer providers questions.  She is able to follow commands.  Fall      Past Medical History:  Diagnosis Date   Dementia ([place])    Depression    HTN (hypertension)    Seizures ([place])     Patient Active Problem List   Diagnosis Date Noted   AMS (altered mental status) 05/21/2020   Breakthrough seizure ([place]) 02/16/2020   Dementia ([place]) 09/08/2017   Essential hypertension 09/08/2017   Hypothyroidism 09/08/2017    No past surgical history on file.   OB History   No obstetric history on file.     No family history on file.  Social History   Tobacco Use   Smoking status: Never   Smokeless tobacco: Never  Substance Use Topics   Alcohol use: Never   Drug use: Never    Home Medications Prior to Admission medications   Medication Sig Start Date End Date Taking? Authorizing Provider  acetaminophen (TYLENOL) 325 MG tablet Take 650 mg by mouth every 4 (four) hours as needed for mild pain, fever or headache.    [provider]  aspirin EC 81 MG EC tablet Take 1 tablet (81 mg total) by mouth daily. Swallow whole. 05/24/20   Pokhrel, Corrie Mckusick, MD  calcium-vitamin D (OSCAL WITH D) 500-200 MG-UNIT TABS tablet Take 1 tablet by mouth daily.    [provider]  Cholecalciferol (VITAMIN D) 50 MCG (2000 UT) tablet Take 2,000 Units by mouth 2 (two) times daily.    [provider]   donepezil (ARICEPT) 10 MG tablet Take 10 mg by mouth daily. 12/23/19   [provider]  escitalopram (LEXAPRO) 10 MG tablet Take 10 mg by mouth daily. 01/07/20   [provider]  gabapentin (NEURONTIN) 300 MG capsule Take 300 mg by mouth 3 (three) times daily. 01/07/20   [provider]  hydrochlorothiazide (HYDRODIURIL) 12.5 MG tablet Take 12.5 mg by mouth daily.    [provider]  levETIRAcetam (KEPPRA) 750 MG tablet Take 750 mg by mouth 2 (two) times daily. 02/19/20   [provider]  levothyroxine (SYNTHROID) 25 MCG tablet Take 25 mcg by mouth daily. 02/20/20   [provider]  losartan (COZAAR) 50 MG tablet Take 50 mg by mouth daily. 12/15/19   [provider]  megestrol (MEGACE) 40 MG/ML suspension Take 10 mLs by mouth daily. For appetite 02/20/20   [provider]  melatonin 5 MG TABS Take 5 mg by mouth at bedtime.    [provider]  metoprolol tartrate (LOPRESSOR) 25 MG tablet Take 1 tablet (25 mg total) by mouth 2 (two) times daily. 05/24/20 05/24/21  Pokhrel, Corrie Mckusick, MD  nystatin (MYCOSTATIN) 100000 UNIT/ML suspension Take 5 mLs by mouth 4 (four) times daily. SWISH AND SPIT    [provider]  nystatin cream (MYCOSTATIN) Apply to affected area 2 times daily (  rash on abdomen) 04/19/20   Dorie Rank, MD  sodium chloride 1 g tablet Take 1 g by mouth 3 (three) times daily. 02/19/20   [provider]    Allergies    Patient has no known allergies.  Review of Systems   Review of Systems  Unable to perform ROS: Dementia (LEVEL 5 CAVEAT)  Skin:  Positive for wound (3cm laceration to forehead).   Physical Exam Updated Vital Signs BP (!) 141/92   Pulse (!) 105   Temp 98.5 F (36.9 C) (Oral)   Resp 18   SpO2 99%   Physical Exam Constitutional:      General: She is not in acute distress.    Appearance: She is not toxic-appearing.  HENT:     Head:     Comments: 3cm laceration to  forehead, bleeding controlled  Eyes:     Comments: Aniscoria present. Right pupil larger and nonresponsive to light. This is a known problem that has been documented in past encounters with this patient. Left pupil responsive to light.   Cardiovascular:     Rate and Rhythm: Normal rate and regular rhythm.  Pulmonary:     Effort: Pulmonary effort is normal.     Breath sounds: Normal breath sounds. No wheezing.  Abdominal:     General: Abdomen is flat.     Palpations: Abdomen is soft.  Musculoskeletal:     Right hip: Normal.     Left hip: Tenderness present.     Comments: Patient has left hip tenderness on palpation.  Clavicles stable. Chest stable to AP/Lat compression. Pelvis stable to Lat compression. No obvious extremity deformity. No chest or abdominal wall contusion.   Skin:    General: Skin is warm and dry.       Neurological:     Mental Status: Mental status is at baseline.     Comments: Patient following commands appropriately     ED Results / Procedures / Treatments   Labs (all labs ordered are listed, but only abnormal results are displayed) Labs Reviewed - No data to display  EKG None  Radiology DG Pelvis 1-2 Views  Result Date: 03/02/2021 CLINICAL DATA:  Left hip pain after fall. EXAM: PELVIS - 1-2 VIEW COMPARISON:  Pelvis x-ray 05/18/2020. FINDINGS: There is no evidence of pelvic fracture or diastasis. No pelvic bone lesions are seen. There are mild degenerative changes of both hips. Degenerative changes also affect the visualized lower lumbar spine. There are vascular calcifications in the soft tissues. IMPRESSION: No acute fracture or dislocation. Electronically Signed   By: Ronney Asters M.D.   On: 03/02/2021 20:56   CT Head Wo Contrast  Result Date: 03/02/2021 CLINICAL DATA:  Trauma. EXAM: CT HEAD WITHOUT CONTRAST CT CERVICAL SPINE WITHOUT CONTRAST TECHNIQUE: Multidetector CT imaging of the head and cervical spine was performed following the standard  protocol without intravenous contrast. Multiplanar CT image reconstructions of the cervical spine were also generated. COMPARISON:  CT dated 02/13/2021. FINDINGS: CT HEAD FINDINGS Brain: Mild age-related atrophy and chronic microvascular ischemic changes. There is no acute intracranial hemorrhage. No mass effect or midline shift. No extra-axial fluid collection. Vascular: No hyperdense vessel or unexpected calcification. Skull: Normal. Negative for fracture or focal lesion. Sinuses/Orbits: Mild mucoperiosteal thickening of paranasal sinuses. No air-fluid level. Mastoid air cells are clear. Other: None CT CERVICAL SPINE FINDINGS Alignment: No acute subluxation. Skull base and vertebrae: No acute fracture.  Osteopenia. Soft tissues and spinal canal: No prevertebral fluid or swelling. No visible  canal hematoma. Disc levels: Multilevel degenerative changes with disc space narrowing and endplate irregularity. Multilevel facet arthropathy. Grade 1 C3-C4 anterolisthesis. Upper chest: Negative. Other: Bilateral carotid bulb calcified plaques. A 2.8 x 1.8 cm soft tissue structure inferior to the right mandible (50/4) appears similar to prior CTs and likely represents the right submandibular gland. Correlation with clinical exam is recommended to exclude an enlarged lymph node. The left submandibular gland is not visualized. IMPRESSION: 1. No acute intracranial pathology. Mild age-related atrophy and chronic microvascular ischemic changes. 2. No acute/traumatic cervical spine pathology. Multilevel degenerative changes. Electronically Signed   By: Anner Crete M.D.   On: 03/02/2021 20:39   CT Cervical Spine Wo Contrast  Result Date: 03/02/2021 CLINICAL DATA:  Trauma. EXAM: CT HEAD WITHOUT CONTRAST CT CERVICAL SPINE WITHOUT CONTRAST TECHNIQUE: Multidetector CT imaging of the head and cervical spine was performed following the standard protocol without intravenous contrast. Multiplanar CT image reconstructions of the  cervical spine were also generated. COMPARISON:  CT dated 02/13/2021. FINDINGS: CT HEAD FINDINGS Brain: Mild age-related atrophy and chronic microvascular ischemic changes. There is no acute intracranial hemorrhage. No mass effect or midline shift. No extra-axial fluid collection. Vascular: No hyperdense vessel or unexpected calcification. Skull: Normal. Negative for fracture or focal lesion. Sinuses/Orbits: Mild mucoperiosteal thickening of paranasal sinuses. No air-fluid level. Mastoid air cells are clear. Other: None CT CERVICAL SPINE FINDINGS Alignment: No acute subluxation. Skull base and vertebrae: No acute fracture.  Osteopenia. Soft tissues and spinal canal: No prevertebral fluid or swelling. No visible canal hematoma. Disc levels: Multilevel degenerative changes with disc space narrowing and endplate irregularity. Multilevel facet arthropathy. Grade 1 C3-C4 anterolisthesis. Upper chest: Negative. Other: Bilateral carotid bulb calcified plaques. A 2.8 x 1.8 cm soft tissue structure inferior to the right mandible (50/4) appears similar to prior CTs and likely represents the right submandibular gland. Correlation with clinical exam is recommended to exclude an enlarged lymph node. The left submandibular gland is not visualized. IMPRESSION: 1. No acute intracranial pathology. Mild age-related atrophy and chronic microvascular ischemic changes. 2. No acute/traumatic cervical spine pathology. Multilevel degenerative changes. Electronically Signed   By: Anner Crete M.D.   On: 03/02/2021 20:39    Procedures .Marland KitchenLaceration Repair  Date/Time: 03/02/2021 11:08 PM Performed by: Azucena Cecil, PA-C Authorized by: Azucena Cecil, PA-C   Consent:    Consent obtained:  Verbal   Consent given by:  Patient   Risks, benefits, and alternatives were discussed: yes     Risks discussed:  Infection, need for additional repair and poor cosmetic result Universal protocol:    Patient identity confirmed:   Verbally with patient and arm band Anesthesia:    Anesthesia method:  Local infiltration   Local anesthetic:  Lidocaine 1% WITH epi Laceration details:    Location:  Scalp   Scalp location:  Frontal   Length (cm):  3 Pre-procedure details:    Preparation:  Patient was prepped and draped in usual sterile fashion Exploration:    Hemostasis achieved with:  Direct pressure   Imaging outcome: foreign body not noted     Wound exploration: wound explored through full range of motion     Contaminated: no   Treatment:    Area cleansed with:  Saline   Amount of cleaning:  Standard   Irrigation solution:  Sterile saline   Irrigation method:  Syringe   Visualized foreign bodies/material removed: no     Debridement:  None   Scar revision: no   Skin  repair:    Repair method:  Sutures   Suture size:  5-0   Suture material:  Nylon   Suture technique:  Simple interrupted   Number of sutures:  6 Approximation:    Approximation:  Close Repair type:    Repair type:  Simple Post-procedure details:    Dressing:  Antibiotic ointment, sterile dressing and adhesive bandage   Procedure completion:  Tolerated well, no immediate complications   Medications Ordered in ED Medications  lidocaine-EPINEPHrine (XYLOCAINE W/EPI) 2 %-1:200000 (PF) injection 10 mL (10 mLs Intradermal Given 03/02/21 2230)    ED Course  I have reviewed the triage vital signs and the nursing notes.  Pertinent labs & imaging results that were available during my care of the patient were reviewed by me and considered in my medical decision making (see chart for details).    MDM Rules/Calculators/A&P                          87 year old female with history of dementia, A. fib, frequent falls.  On exam, patient lethargic but able to follow commands.  Patient noted to have unequal pupil sizes, but on examination of patient's chart this is a known problem.  Due to patient left hip pain we will image her pelvis with plain  x-rays.  Due to laceration to forehead will presume that she hit her head.  We will evaluate with CT head and spine without contrast.  Assuming no abnormalities are noted on left hip x-ray and CT head and spine, will repair patient forehead laceration accordingly and plan for discharge back to SNF.  Patient pelvis x-rays have no findings of fracture. Patient CT head and CT spine without contrast showed no fractures or cause for concern.  Patient C-spine cleared.  On reevaluation, patient is much more alert and requesting food, wanting to know when she can go home.  I will repair the patient's head laceration and plan for discharge.  Patient head laceration has been repaired. Patient is stable for discharge at this time.       Final Clinical Impression(s) / ED Diagnoses Final diagnoses:  Fall, initial encounter  Laceration of scalp, initial encounter    Rx / DC Orders ED Discharge Orders     None        Clent Ridges 03/02/21 2317    Arby Barrette, MD 03/02/21 2340

## 2021-03-03 NOTE — ED Notes (Signed)
Attempted report to [facility]. No response.

## 2021-03-06 ENCOUNTER — Encounter (HOSPITAL_COMMUNITY): Payer: Self-pay | Admitting: Emergency Medicine

## 2021-03-06 ENCOUNTER — Other Ambulatory Visit: Payer: Self-pay

## 2021-03-06 ENCOUNTER — Emergency Department (HOSPITAL_COMMUNITY): Payer: Medicare Other

## 2021-03-06 ENCOUNTER — Emergency Department (HOSPITAL_COMMUNITY)
Admission: EM | Admit: 2021-03-06 | Discharge: 2021-03-07 | Disposition: A | Discharge status: Home / Self Care | Payer: Medicare Other | Attending: Emergency Medicine | Admitting: Emergency Medicine

## 2021-03-06 DIAGNOSIS — I10 Essential (primary) hypertension: Secondary | ICD-10-CM | POA: Diagnosis not present

## 2021-03-06 DIAGNOSIS — W06XXXA Fall from bed, initial encounter: Secondary | ICD-10-CM | POA: Diagnosis not present

## 2021-03-06 DIAGNOSIS — E039 Hypothyroidism, unspecified: Secondary | ICD-10-CM | POA: Insufficient documentation

## 2021-03-06 DIAGNOSIS — Z23 Encounter for immunization: Secondary | ICD-10-CM | POA: Insufficient documentation

## 2021-03-06 DIAGNOSIS — Z7982 Long term (current) use of aspirin: Secondary | ICD-10-CM | POA: Diagnosis not present

## 2021-03-06 DIAGNOSIS — S0181XA Laceration without foreign body of other part of head, initial encounter: Secondary | ICD-10-CM | POA: Insufficient documentation

## 2021-03-06 DIAGNOSIS — Z79899 Other long term (current) drug therapy: Secondary | ICD-10-CM | POA: Insufficient documentation

## 2021-03-06 DIAGNOSIS — W19XXXA Unspecified fall, initial encounter: Secondary | ICD-10-CM

## 2021-03-06 DIAGNOSIS — S0990XA Unspecified injury of head, initial encounter: Secondary | ICD-10-CM | POA: Diagnosis present

## 2021-03-06 DIAGNOSIS — F0390 Unspecified dementia without behavioral disturbance: Secondary | ICD-10-CM | POA: Insufficient documentation

## 2021-03-06 NOTE — ED Provider Notes (Signed)
Emergency Medicine Provider Triage Evaluation Note  Heidi Bean , a 87 y.o. female  was evaluated in triage.  Pt complains of unwitnessed fall from her bed.  She is from [facility]. She states that she had been sitting on the side of her bed and fell asleep causing her to fall.  She reports pain in her head.  She arrives with c-collar in place.  She denies any pain in her chest or abdomen.  Review of Systems  Positive: Head laceration Negative: Chest or abdominal pain  Physical Exam  BP 122/77   Pulse 78   Temp 97.8 F (36.6 C) (Oral)   Resp 16   SpO2 100%  Gen:   Awake, no distress   Resp:  Normal effort  MSK:   Moves extremities without difficulty  Other:  There is a large contusion on the left-sided forehead.  There is a small laceration, bleeding is controlled.    Medical Decision Making  Medically screening exam initiated at 8:34 PM.  Appropriate orders placed.  Heidi Bean was informed that the remainder of the evaluation will be completed by another provider, this initial triage assessment does not replace that evaluation, and the importance of remaining in the ED until their evaluation is complete.     Norman Clay 03/06/21 2126    Pollyann Savoy, MD 03/07/21 1919

## 2021-03-06 NOTE — ED Triage Notes (Addendum)
Patient arrived with EMS from [facility] nursing home , unwitnessed fall from her bed this evening , no LOC . No anticoagulant medication , denies pain , CBG= 164. Presents with left forehead hematoma and laceration /bleeding controlled ,C- collar applied by EMS .

## 2021-03-06 NOTE — ED Notes (Signed)
Patient transported to CT scan . 

## 2021-03-07 MED ORDER — TETANUS-DIPHTH-ACELL PERTUSSIS 5-2.5-18.5 LF-MCG/0.5 IM SUSY
0.5000 mL | PREFILLED_SYRINGE | Freq: Once | INTRAMUSCULAR | Status: AC
  Administered 2021-03-07: 0.5 mL via INTRAMUSCULAR
  Filled 2021-03-07: qty 0.5

## 2021-03-07 MED ORDER — ACETAMINOPHEN 500 MG PO TABS
1000.0000 mg | ORAL_TABLET | Freq: Once | ORAL | Status: AC
  Administered 2021-03-07: 1000 mg via ORAL
  Filled 2021-03-07: qty 2

## 2021-03-07 MED ORDER — LIDOCAINE-EPINEPHRINE (PF) 2 %-1:200000 IJ SOLN
10.0000 mL | Freq: Once | INTRAMUSCULAR | Status: AC
  Administered 2021-03-07: 10 mL
  Filled 2021-03-07: qty 20

## 2021-03-07 NOTE — ED Notes (Signed)
E-signature pad unavailable at time of pt discharge. This RN discussed discharge materials with pt and answered all pt questions. Pt stated understanding of discharge material. ? ?

## 2021-03-07 NOTE — Discharge Instructions (Addendum)
1. Medications: Tylenol for pain, usual home medications 2. Treatment: ice for swelling, keep wound clean with warm soap and water and keep bandage dry, do not submerge in water for 24 hours 3. Follow Up: Please return in 7 days to have your stitches/staples removed or sooner if you have concerns. Return to the emergency department for increased redness, drainage of pus from the wound   WOUND CARE  Keep area clean and dry for 24 hours. Do not remove bandage, if applied.  After 24 hours, remove bandage and wash wound gently with mild soap and warm water. Reapply a new bandage after cleaning wound, if directed.   Continue daily cleansing with soap and water until stitches/staples are removed.  Do not apply any ointments or creams to the wound while stitches/staples are in place, as this may cause delayed healing. Return if you experience any of the following signs of infection: Swelling, redness, pus drainage, streaking, fever >101.0 F  Return if you experience excessive bleeding that does not stop after 15-20 minutes of constant, firm pressure.

## 2021-03-07 NOTE — ED Provider Notes (Signed)
[facility]   CSN: [id number] Arrival date & time: 03/06/21  1931     History Chief Complaint  Patient presents with   Fall    Head Laceration     Heidi Bean is a 87 y.o. female [facility].  Pt reports she was sitting on the end of the bed when she fell asleep and hit her head.  Pt reports she has a headache now, but denies any other symptoms including numbness and weakness.  Pt reports she falls frequently.    Level 5 CAVEAT for dementia.    Discussed with Pepper, [facility].  She was told that Pt fell before 7pm and had a laceration. Pt was sent to the ER by day shift and there was no additional information.   The history is provided by the patient and medical records. No language interpreter was used.      Past Medical History:  Diagnosis Date   Dementia (HCC)    Depression    HTN (hypertension)    Seizures (HCC)     Patient Active Problem List   Diagnosis Date Noted   AMS (altered mental status) 05/21/2020   Breakthrough seizure (HCC) 02/16/2020   Dementia (HCC) 09/08/2017   Essential hypertension 09/08/2017   Hypothyroidism 09/08/2017    History reviewed. No pertinent surgical history.   OB History   No obstetric history on file.     No family history on file.  Social History   Tobacco Use   Smoking status: Never   Smokeless tobacco: Never  Substance Use Topics   Alcohol use: Never   Drug use: Never    Home Medications Prior to Admission medications   Medication Sig Start Date End Date Taking? Authorizing Provider  acetaminophen (TYLENOL) 325 MG tablet Take 650 mg by mouth every 4 (four) hours as needed for mild pain, fever or headache.    [provider]  aspirin EC 81 MG EC tablet Take 1 tablet (81 mg total) by mouth daily. Swallow whole. 05/24/20   Pokhrel, Rebekah ChesterfieldLaxman, MD  calcium-vitamin D (OSCAL WITH D)  500-200 MG-UNIT TABS tablet Take 1 tablet by mouth daily.    [provider]  Cholecalciferol (VITAMIN D) 50 MCG (2000 UT) tablet Take 2,000 Units by mouth 2 (two) times daily.    [provider]  donepezil (ARICEPT) 10 MG tablet Take 10 mg by mouth daily. 12/23/19   [provider]  escitalopram (LEXAPRO) 10 MG tablet Take 10 mg by mouth daily. 01/07/20   [provider]  gabapentin (NEURONTIN) 300 MG capsule Take 300 mg by mouth 3 (three) times daily. 01/07/20   [provider]  hydrochlorothiazide (HYDRODIURIL) 12.5 MG tablet Take 12.5 mg by mouth daily.    [provider]  levETIRAcetam (KEPPRA) 750 MG tablet Take 750 mg by mouth 2 (two) times daily. 02/19/20   [provider]  levothyroxine (SYNTHROID) 25 MCG tablet Take 25 mcg by mouth daily. 02/20/20   [provider]  losartan (COZAAR) 50 MG tablet Take 50 mg by mouth daily. 12/15/19   [provider]  megestrol (MEGACE) 40 MG/ML suspension Take 10 mLs by mouth daily. For appetite 02/20/20   [provider]  melatonin 5 MG TABS Take 5 mg by mouth at bedtime.    [provider]  metoprolol tartrate (LOPRESSOR) 25 MG tablet Take 1 tablet (25 mg total)  by mouth 2 (two) times daily. 05/24/20 05/24/21  Pokhrel, Rebekah Chesterfield, MD  nystatin (MYCOSTATIN) 100000 UNIT/ML suspension Take 5 mLs by mouth 4 (four) times daily. SWISH AND SPIT    [provider]  nystatin cream (MYCOSTATIN) Apply to affected area 2 times daily (rash on abdomen) 04/19/20   Linwood Dibbles, MD  sodium chloride 1 g tablet Take 1 g by mouth 3 (three) times daily. 02/19/20   [provider]    Allergies    Patient has no known allergies.  Review of Systems   Review of Systems  Unable to perform ROS: Dementia  HENT:  Positive for facial swelling.   Skin:  Positive for wound.  Neurological:  Positive for headaches.   Physical Exam Updated Vital Signs BP (!) 165/97 (BP  Location: Right Arm)   Pulse 81   Temp 97.8 F (36.6 C) (Oral)   Resp 18   SpO2 100%   Physical Exam Vitals and nursing note reviewed.  Constitutional:      General: She is not in acute distress.    Appearance: She is not diaphoretic.  HENT:     Head: Normocephalic.   Eyes:     General: No scleral icterus.    Extraocular Movements:     Right eye: Normal extraocular motion.     Left eye: Normal extraocular motion.     Conjunctiva/sclera: Conjunctivae normal.     Comments: L pupil - midpoint and reactive R pupil - irregular and non reactive  Cardiovascular:     Rate and Rhythm: Normal rate and regular rhythm.     Pulses: Normal pulses.          Radial pulses are 2+ on the right side and 2+ on the left side.  Pulmonary:     Effort: No tachypnea, accessory muscle usage, prolonged expiration, respiratory distress or retractions.     Breath sounds: No stridor.     Comments: Equal chest rise. No increased work of breathing. Abdominal:     General: There is no distension.     Palpations: Abdomen is soft.     Tenderness: There is no abdominal tenderness. There is no guarding or rebound.  Musculoskeletal:     Cervical back: Normal range of motion.     Comments: Moves all extremities equally and without difficulty.  Skin:    General: Skin is warm and dry.     Capillary Refill: Capillary refill takes less than 2 seconds.  Neurological:     Mental Status: She is alert.     GCS: GCS eye subscore is 4. GCS verbal subscore is 5. GCS motor subscore is 6.     Comments: Speech is clear and goal oriented.  Psychiatric:        Mood and Affect: Mood normal.    ED Results / Procedures / Treatments     Radiology CT Head Wo Contrast  Result Date: 03/06/2021 CLINICAL DATA:  Facial trauma, blunt. Unwitnessed fall. Left forehead hematoma and laceration. EXAM: CT HEAD WITHOUT CONTRAST CT MAXILLOFACIAL WITHOUT CONTRAST CT CERVICAL SPINE WITHOUT CONTRAST TECHNIQUE: Multidetector CT imaging  of the head, cervical spine, and maxillofacial structures were performed using the standard protocol without intravenous contrast. Multiplanar CT image reconstructions of the cervical spine and maxillofacial structures were also generated. COMPARISON:  03/02/2021. FINDINGS: CT HEAD FINDINGS Brain: No acute intracranial hemorrhage, midline shift or mass effect. Diffuse cortical atrophy is noted. No extra-axial fluid collection is identified. Periventricular white matter hypodensities are noted bilaterally. There is  no hydrocephalus. Vascular: No hyperdense vessel or unexpected calcification. Skull: Normal. Negative for fracture or focal lesion. Other: Scalp hematoma containing air is present over the frontal bone on the left. CT MAXILLOFACIAL FINDINGS Osseous: No fracture or mandibular dislocation. No destructive process. Orbits: No traumatic or inflammatory finding. Sinuses: Mild mucosal thickening is present in the right maxillary sinus in ethmoid air cells bilaterally. No air-fluid levels are seen. Soft tissues: A scalp hematoma containing air is present over the frontal bone on the left. CT CERVICAL SPINE FINDINGS Alignment: There is anterolisthesis at C2-C3, C3-C4 and C7-T1. No evidence of perched facet. Skull base and vertebrae: No acute fracture. No primary bone lesion or focal pathologic process. Soft tissues and spinal canal: No prevertebral fluid or swelling. No visible canal hematoma. Disc levels: Multilevel intervertebral disc space narrowing, uncovertebral osteophyte formation and facet arthropathy are noted resulting in mild spinal canal and mild-to-moderate neural foraminal stenosis. There is severe neural foraminal stenosis at C3-C4 on the left. Upper chest: Mild apical pleural scarring is noted on the left. Other: Calcifications at the carotid bulbs bilaterally. Hypodense lesion in the left lobe of the thyroid gland is unchanged. IMPRESSION: 1. No acute intracranial hemorrhage. 2. Atrophy with  chronic microvascular ischemic changes. 3. Scalp hematoma containing air over the frontal bone on the left. 4. No acute facial bone fracture. 5. Stable multilevel degenerative changes in the cervical spine without evidence of acute fracture. 6. Stable hypodense region in the left lobe of the thyroid gland. Electronically Signed   By: Thornell Sartorius M.D.   On: 03/06/2021 21:39   CT Cervical Spine Wo Contrast  Result Date: 03/06/2021 CLINICAL DATA:  Facial trauma, blunt. Unwitnessed fall. Left forehead hematoma and laceration. EXAM: CT HEAD WITHOUT CONTRAST CT MAXILLOFACIAL WITHOUT CONTRAST CT CERVICAL SPINE WITHOUT CONTRAST TECHNIQUE: Multidetector CT imaging of the head, cervical spine, and maxillofacial structures were performed using the standard protocol without intravenous contrast. Multiplanar CT image reconstructions of the cervical spine and maxillofacial structures were also generated. COMPARISON:  03/02/2021. FINDINGS: CT HEAD FINDINGS Brain: No acute intracranial hemorrhage, midline shift or mass effect. Diffuse cortical atrophy is noted. No extra-axial fluid collection is identified. Periventricular white matter hypodensities are noted bilaterally. There is no hydrocephalus. Vascular: No hyperdense vessel or unexpected calcification. Skull: Normal. Negative for fracture or focal lesion. Other: Scalp hematoma containing air is present over the frontal bone on the left. CT MAXILLOFACIAL FINDINGS Osseous: No fracture or mandibular dislocation. No destructive process. Orbits: No traumatic or inflammatory finding. Sinuses: Mild mucosal thickening is present in the right maxillary sinus in ethmoid air cells bilaterally. No air-fluid levels are seen. Soft tissues: A scalp hematoma containing air is present over the frontal bone on the left. CT CERVICAL SPINE FINDINGS Alignment: There is anterolisthesis at C2-C3, C3-C4 and C7-T1. No evidence of perched facet. Skull base and vertebrae: No acute fracture. No  primary bone lesion or focal pathologic process. Soft tissues and spinal canal: No prevertebral fluid or swelling. No visible canal hematoma. Disc levels: Multilevel intervertebral disc space narrowing, uncovertebral osteophyte formation and facet arthropathy are noted resulting in mild spinal canal and mild-to-moderate neural foraminal stenosis. There is severe neural foraminal stenosis at C3-C4 on the left. Upper chest: Mild apical pleural scarring is noted on the left. Other: Calcifications at the carotid bulbs bilaterally. Hypodense lesion in the left lobe of the thyroid gland is unchanged. IMPRESSION: 1. No acute intracranial hemorrhage. 2. Atrophy with chronic microvascular ischemic changes. 3. Scalp hematoma containing air over  the frontal bone on the left. 4. No acute facial bone fracture. 5. Stable multilevel degenerative changes in the cervical spine without evidence of acute fracture. 6. Stable hypodense region in the left lobe of the thyroid gland. Electronically Signed   By: Brett Fairy M.D.   On: 03/06/2021 21:39   CT Maxillofacial WO CM  Result Date: 03/06/2021 CLINICAL DATA:  Facial trauma, blunt. Unwitnessed fall. Left forehead hematoma and laceration. EXAM: CT HEAD WITHOUT CONTRAST CT MAXILLOFACIAL WITHOUT CONTRAST CT CERVICAL SPINE WITHOUT CONTRAST TECHNIQUE: Multidetector CT imaging of the head, cervical spine, and maxillofacial structures were performed using the standard protocol without intravenous contrast. Multiplanar CT image reconstructions of the cervical spine and maxillofacial structures were also generated. COMPARISON:  03/02/2021. FINDINGS: CT HEAD FINDINGS Brain: No acute intracranial hemorrhage, midline shift or mass effect. Diffuse cortical atrophy is noted. No extra-axial fluid collection is identified. Periventricular white matter hypodensities are noted bilaterally. There is no hydrocephalus. Vascular: No hyperdense vessel or unexpected calcification. Skull: Normal.  Negative for fracture or focal lesion. Other: Scalp hematoma containing air is present over the frontal bone on the left. CT MAXILLOFACIAL FINDINGS Osseous: No fracture or mandibular dislocation. No destructive process. Orbits: No traumatic or inflammatory finding. Sinuses: Mild mucosal thickening is present in the right maxillary sinus in ethmoid air cells bilaterally. No air-fluid levels are seen. Soft tissues: A scalp hematoma containing air is present over the frontal bone on the left. CT CERVICAL SPINE FINDINGS Alignment: There is anterolisthesis at C2-C3, C3-C4 and C7-T1. No evidence of perched facet. Skull base and vertebrae: No acute fracture. No primary bone lesion or focal pathologic process. Soft tissues and spinal canal: No prevertebral fluid or swelling. No visible canal hematoma. Disc levels: Multilevel intervertebral disc space narrowing, uncovertebral osteophyte formation and facet arthropathy are noted resulting in mild spinal canal and mild-to-moderate neural foraminal stenosis. There is severe neural foraminal stenosis at C3-C4 on the left. Upper chest: Mild apical pleural scarring is noted on the left. Other: Calcifications at the carotid bulbs bilaterally. Hypodense lesion in the left lobe of the thyroid gland is unchanged. IMPRESSION: 1. No acute intracranial hemorrhage. 2. Atrophy with chronic microvascular ischemic changes. 3. Scalp hematoma containing air over the frontal bone on the left. 4. No acute facial bone fracture. 5. Stable multilevel degenerative changes in the cervical spine without evidence of acute fracture. 6. Stable hypodense region in the left lobe of the thyroid gland. Electronically Signed   By: Brett Fairy M.D.   On: 03/06/2021 21:39    Procedures .Marland KitchenLaceration Repair  Date/Time: 03/07/2021 2:57 AM Performed by: Abigail Butts, PA-C Authorized by: Abigail Butts, PA-C   Consent:    Consent obtained:  Verbal   Consent given by:  Patient   Risks  discussed:  Infection, need for additional repair, pain, poor cosmetic result and poor wound healing   Alternatives discussed:  No treatment and delayed treatment Universal protocol:    Procedure explained and questions answered to patient or proxy's satisfaction: yes     Relevant documents present and verified: yes     Test results available: yes     Imaging studies available: yes     Required blood products, implants, devices, and special equipment available: yes     Site/side marked: yes     Immediately prior to procedure, a time out was called: yes     Patient identity confirmed:  Verbally with patient and arm band Anesthesia:    Anesthesia method:  Local infiltration  Local anesthetic:  Lidocaine 2% WITH epi Laceration details:    Location:  Face   Face location:  Forehead   Length (cm):  2 Pre-procedure details:    Preparation:  Patient was prepped and draped in usual sterile fashion and imaging obtained to evaluate for foreign bodies Exploration:    Hemostasis achieved with:  Epinephrine and direct pressure Treatment:    Area cleansed with:  Saline   Amount of cleaning:  Extensive   Irrigation solution:  Sterile water   Irrigation volume:  500   Irrigation method:  Syringe Skin repair:    Repair method:  Sutures   Suture size:  5-0   Suture material:  Nylon   Suture technique:  Simple interrupted   Number of sutures:  2 Approximation:    Approximation:  Close Repair type:    Repair type:  Simple Post-procedure details:    Dressing:  Open (no dressing)   Procedure completion:  Tolerated well, no immediate complications   Medications Ordered in ED Medications  lidocaine-EPINEPHrine (XYLOCAINE W/EPI) 2 %-1:200000 (PF) injection 10 mL (has no administration in time range)  Tdap (BOOSTRIX) injection 0.5 mL (has no administration in time range)  acetaminophen (TYLENOL) tablet 1,000 mg (1,000 mg Oral Given 03/07/21 0204)    ED Course  I have reviewed the triage  vital signs and the nursing notes.  Pertinent labs & imaging results that were available during my care of the patient were reviewed by me and considered in my medical decision making (see chart for details).    MDM Rules/Calculators/A&P                           Patient presents with large forehead hematoma and laceration after fall.  No clear history however patient reports it was mechanical.  She does have a history of seizures but no report of same.  No oral trauma noted.  Patient alert and interactive.    CT scan without acute intracranial hemorrhage or cervical fracture. C-collar removed.  Patient with full range of motion without pain.  No step-off or deformity.  Forehead laceration cleaned and repaired.  Patient continues to be talkative and interactive.  Eating and drinking without difficulty.    The patient was discussed with Dr. Betsey Holiday who agrees with the treatment plan.  Final Clinical Impression(s) / ED Diagnoses Final diagnoses:  Fall, initial encounter  Laceration of forehead, initial encounter    Rx / DC Orders ED Discharge Orders     None        Kindall Swaby, Gwenlyn Perking 03/07/21 [id number]    Orpah Greek, MD 03/07/21 [phone]

## 2021-03-07 NOTE — ED Notes (Signed)
[facility] called to transport patient to [facility]

## 2021-03-21 ENCOUNTER — Inpatient Hospital Stay (HOSPITAL_COMMUNITY)
Admission: EM | Admit: 2021-03-21 | Discharge: 2021-03-25 | DRG: 951 | Disposition: A | Discharge status: Hospice | Payer: Medicare Other | Source: Skilled Nursing Facility | Attending: Internal Medicine | Admitting: Internal Medicine

## 2021-03-21 ENCOUNTER — Inpatient Hospital Stay (HOSPITAL_COMMUNITY): Payer: Medicare Other

## 2021-03-21 ENCOUNTER — Emergency Department (HOSPITAL_COMMUNITY): Payer: Medicare Other

## 2021-03-21 ENCOUNTER — Encounter (HOSPITAL_COMMUNITY): Payer: Self-pay | Admitting: Emergency Medicine

## 2021-03-21 DIAGNOSIS — E872 Acidosis, unspecified: Secondary | ICD-10-CM | POA: Diagnosis present

## 2021-03-21 DIAGNOSIS — R296 Repeated falls: Secondary | ICD-10-CM | POA: Diagnosis present

## 2021-03-21 DIAGNOSIS — S065XAA Traumatic subdural hemorrhage with loss of consciousness status unknown, initial encounter: Secondary | ICD-10-CM | POA: Diagnosis present

## 2021-03-21 DIAGNOSIS — G9340 Encephalopathy, unspecified: Secondary | ICD-10-CM | POA: Diagnosis present

## 2021-03-21 DIAGNOSIS — R17 Unspecified jaundice: Secondary | ICD-10-CM | POA: Diagnosis present

## 2021-03-21 DIAGNOSIS — R7989 Other specified abnormal findings of blood chemistry: Secondary | ICD-10-CM | POA: Diagnosis present

## 2021-03-21 DIAGNOSIS — Z515 Encounter for palliative care: Secondary | ICD-10-CM | POA: Diagnosis not present

## 2021-03-21 DIAGNOSIS — Z7189 Other specified counseling: Secondary | ICD-10-CM | POA: Diagnosis not present

## 2021-03-21 DIAGNOSIS — F0390 Unspecified dementia without behavioral disturbance: Secondary | ICD-10-CM | POA: Diagnosis present

## 2021-03-21 DIAGNOSIS — G40909 Epilepsy, unspecified, not intractable, without status epilepticus: Secondary | ICD-10-CM

## 2021-03-21 DIAGNOSIS — I2489 Other forms of acute ischemic heart disease: Secondary | ICD-10-CM | POA: Diagnosis present

## 2021-03-21 DIAGNOSIS — W19XXXA Unspecified fall, initial encounter: Secondary | ICD-10-CM | POA: Diagnosis present

## 2021-03-21 DIAGNOSIS — E8809 Other disorders of plasma-protein metabolism, not elsewhere classified: Secondary | ICD-10-CM | POA: Diagnosis present

## 2021-03-21 DIAGNOSIS — R402434 Glasgow coma scale score 3-8, 24 hours or more after hospital admission: Secondary | ICD-10-CM | POA: Diagnosis not present

## 2021-03-21 DIAGNOSIS — G8191 Hemiplegia, unspecified affecting right dominant side: Secondary | ICD-10-CM | POA: Diagnosis present

## 2021-03-21 DIAGNOSIS — I480 Paroxysmal atrial fibrillation: Secondary | ICD-10-CM | POA: Diagnosis present

## 2021-03-21 DIAGNOSIS — R4182 Altered mental status, unspecified: Secondary | ICD-10-CM | POA: Diagnosis not present

## 2021-03-21 DIAGNOSIS — G935 Compression of brain: Secondary | ICD-10-CM | POA: Diagnosis not present

## 2021-03-21 DIAGNOSIS — E871 Hypo-osmolality and hyponatremia: Secondary | ICD-10-CM | POA: Diagnosis present

## 2021-03-21 DIAGNOSIS — Z7982 Long term (current) use of aspirin: Secondary | ICD-10-CM

## 2021-03-21 DIAGNOSIS — I248 Other forms of acute ischemic heart disease: Secondary | ICD-10-CM | POA: Diagnosis present

## 2021-03-21 DIAGNOSIS — Z79899 Other long term (current) drug therapy: Secondary | ICD-10-CM

## 2021-03-21 DIAGNOSIS — I4891 Unspecified atrial fibrillation: Secondary | ICD-10-CM | POA: Diagnosis not present

## 2021-03-21 DIAGNOSIS — R778 Other specified abnormalities of plasma proteins: Secondary | ICD-10-CM | POA: Diagnosis not present

## 2021-03-21 DIAGNOSIS — I10 Essential (primary) hypertension: Secondary | ICD-10-CM | POA: Diagnosis present

## 2021-03-21 DIAGNOSIS — R402413 Glasgow coma scale score 13-15, at hospital admission: Secondary | ICD-10-CM | POA: Diagnosis present

## 2021-03-21 DIAGNOSIS — Z66 Do not resuscitate: Secondary | ICD-10-CM | POA: Diagnosis present

## 2021-03-21 DIAGNOSIS — E039 Hypothyroidism, unspecified: Secondary | ICD-10-CM | POA: Diagnosis present

## 2021-03-21 DIAGNOSIS — Z789 Other specified health status: Secondary | ICD-10-CM | POA: Diagnosis not present

## 2021-03-21 DIAGNOSIS — E876 Hypokalemia: Secondary | ICD-10-CM | POA: Diagnosis present

## 2021-03-21 DIAGNOSIS — S06A1XA Traumatic brain compression with herniation, initial encounter: Secondary | ICD-10-CM | POA: Diagnosis present

## 2021-03-21 DIAGNOSIS — Z79890 Hormone replacement therapy (postmenopausal): Secondary | ICD-10-CM

## 2021-03-21 DIAGNOSIS — D509 Iron deficiency anemia, unspecified: Secondary | ICD-10-CM | POA: Diagnosis present

## 2021-03-21 DIAGNOSIS — U071 COVID-19: Secondary | ICD-10-CM | POA: Diagnosis present

## 2021-03-21 DIAGNOSIS — E46 Unspecified protein-calorie malnutrition: Secondary | ICD-10-CM | POA: Diagnosis present

## 2021-03-21 DIAGNOSIS — H5704 Mydriasis: Secondary | ICD-10-CM | POA: Diagnosis not present

## 2021-03-21 LAB — COMPREHENSIVE METABOLIC PANEL
ALT: 11 U/L (ref 0–44)
AST: 21 U/L (ref 15–41)
Albumin: 3.3 g/dL — ABNORMAL LOW (ref 3.5–5.0)
Alkaline Phosphatase: 101 U/L (ref 38–126)
Anion gap: 12 (ref 5–15)
BUN: 12 mg/dL (ref 8–23)
CO2: 22 mmol/L (ref 22–32)
Calcium: 8.7 mg/dL — ABNORMAL LOW (ref 8.9–10.3)
Chloride: 99 mmol/L (ref 98–111)
Creatinine, Ser: 0.85 mg/dL (ref 0.44–1.00)
GFR, Estimated: >60 mL/min (ref >60)
Glucose, Bld: 118 mg/dL — ABNORMAL HIGH (ref 70–99)
Potassium: 4 mmol/L (ref 3.5–5.1)
Sodium: 133 mmol/L — ABNORMAL LOW (ref 135–145)
Total Bilirubin: 2.3 mg/dL — ABNORMAL HIGH (ref 0.3–1.2)
Total Protein: 7.1 g/dL (ref 6.5–8.1)

## 2021-03-21 LAB — URINALYSIS, ROUTINE W REFLEX MICROSCOPIC
Bilirubin Urine: NEGATIVE
Glucose, UA: NEGATIVE mg/dL
Hgb urine dipstick: NEGATIVE
Ketones, ur: 40 mg/dL — AB
Leukocytes,Ua: NEGATIVE
Nitrite: NEGATIVE
Protein, ur: 30 mg/dL — AB
Specific Gravity, Urine: 1.025 (ref 1.005–1.030)
pH: 6.5 (ref 5.0–8.0)

## 2021-03-21 LAB — CBC WITH DIFFERENTIAL/PLATELET
Abs Immature Granulocytes: 0.08 10*3/uL — ABNORMAL HIGH (ref 0.00–0.07)
Basophils Absolute: 0.1 10*3/uL (ref 0.0–0.1)
Basophils Relative: 1 %
Eosinophils Absolute: 0 10*3/uL (ref 0.0–0.5)
Eosinophils Relative: 0 %
HCT: 37.1 % (ref 36.0–46.0)
Hemoglobin: 11.4 g/dL — ABNORMAL LOW (ref 12.0–15.0)
Immature Granulocytes: 1 %
Lymphocytes Relative: 6 %
Lymphs Abs: 0.7 10*3/uL (ref 0.7–4.0)
MCH: 23.8 pg — ABNORMAL LOW (ref 26.0–34.0)
MCHC: 30.7 g/dL (ref 30.0–36.0)
MCV: 77.5 fL — ABNORMAL LOW (ref 80.0–100.0)
Monocytes Absolute: 0.9 10*3/uL (ref 0.1–1.0)
Monocytes Relative: 8 %
Neutro Abs: 10 10*3/uL — ABNORMAL HIGH (ref 1.7–7.7)
Neutrophils Relative %: 84 %
Platelets: 323 10*3/uL (ref 150–400)
RBC: 4.79 MIL/uL (ref 3.87–5.11)
RDW: 19.1 % — ABNORMAL HIGH (ref 11.5–15.5)
WBC: 11.9 10*3/uL — ABNORMAL HIGH (ref 4.0–10.5)
nRBC: 0 % (ref 0.0–0.2)

## 2021-03-21 LAB — TROPONIN I (HIGH SENSITIVITY)
Troponin I (High Sensitivity): 42 ng/L — ABNORMAL HIGH (ref <18)
Troponin I (High Sensitivity): 51 ng/L — ABNORMAL HIGH (ref <18)

## 2021-03-21 LAB — C-REACTIVE PROTEIN: CRP: 2.8 mg/dL — ABNORMAL HIGH (ref <1.0)

## 2021-03-21 LAB — CBG MONITORING, ED
Glucose-Capillary: 117 mg/dL — ABNORMAL HIGH (ref 70–99)
Glucose-Capillary: 137 mg/dL — ABNORMAL HIGH (ref 70–99)

## 2021-03-21 LAB — PROTIME-INR
INR: 1.1 (ref 0.8–1.2)
Prothrombin Time: 14.2 seconds (ref 11.4–15.2)

## 2021-03-21 LAB — TSH: TSH: 2.771 u[IU]/mL (ref 0.350–4.500)

## 2021-03-21 LAB — URINALYSIS, MICROSCOPIC (REFLEX): Bacteria, UA: NONE SEEN

## 2021-03-21 LAB — LACTIC ACID, PLASMA
Lactic Acid, Venous: 2.6 mmol/L (ref 0.5–1.9)
Lactic Acid, Venous: 2.7 mmol/L (ref 0.5–1.9)

## 2021-03-21 LAB — RESP PANEL BY RT-PCR (FLU A&B, COVID) ARPGX2
Influenza A by PCR: NEGATIVE
Influenza B by PCR: NEGATIVE
SARS Coronavirus 2 by RT PCR: POSITIVE — AB

## 2021-03-21 LAB — SEDIMENTATION RATE: Sed Rate: 22 mm/hr (ref 0–22)

## 2021-03-21 LAB — VITAMIN B12: Vitamin B-12: 297 pg/mL (ref 180–914)

## 2021-03-21 LAB — MAGNESIUM: Magnesium: 1.7 mg/dL (ref 1.7–2.4)

## 2021-03-21 LAB — AMMONIA: Ammonia: 30 umol/L (ref 9–35)

## 2021-03-21 MED ORDER — DILTIAZEM HCL-DEXTROSE 125-5 MG/125ML-% IV SOLN (PREMIX)
5.0000 mg/h | INTRAVENOUS | Status: DC
  Administered 2021-03-21: 13:00:00 5 mg/h via INTRAVENOUS
  Administered 2021-03-22: 01:00:00 10 mg/h via INTRAVENOUS
  Filled 2021-03-21 (×2): qty 125

## 2021-03-21 MED ORDER — SODIUM CHLORIDE 0.9 % IV SOLN
2.0000 g | Freq: Two times a day (BID) | INTRAVENOUS | Status: DC
  Administered 2021-03-22: 2 g via INTRAVENOUS
  Filled 2021-03-21: qty 2

## 2021-03-21 MED ORDER — ACETAMINOPHEN 500 MG PO TABS: 1000.0000 mg | ORAL_TABLET | Freq: Four times a day (QID) | ORAL | Status: DC | PRN

## 2021-03-21 MED ORDER — MORPHINE SULFATE (PF) 2 MG/ML IV SOLN
0.5000 mg | INTRAVENOUS | Status: DC | PRN
  Administered 2021-03-22: 05:00:00 0.5 mg via INTRAVENOUS
  Filled 2021-03-21: qty 1

## 2021-03-21 MED ORDER — ENOXAPARIN SODIUM 40 MG/0.4ML IJ SOSY
40.0000 mg | PREFILLED_SYRINGE | INTRAMUSCULAR | Status: DC
  Administered 2021-03-21: 16:00:00 40 mg via SUBCUTANEOUS
  Filled 2021-03-21: qty 0.4

## 2021-03-21 MED ORDER — VANCOMYCIN HCL 1500 MG/300ML IV SOLN
1500.0000 mg | Freq: Once | INTRAVENOUS | Status: AC
  Administered 2021-03-21: 13:00:00 1500 mg via INTRAVENOUS
  Filled 2021-03-21: qty 300

## 2021-03-21 MED ORDER — SODIUM CHLORIDE 0.9 % IV SOLN
750.0000 mg | Freq: Two times a day (BID) | INTRAVENOUS | Status: DC
  Administered 2021-03-21 – 2021-03-25 (×8): 750 mg via INTRAVENOUS
  Filled 2021-03-21 (×11): qty 7.5

## 2021-03-21 MED ORDER — VANCOMYCIN HCL IN DEXTROSE 1-5 GM/200ML-% IV SOLN
1000.0000 mg | INTRAVENOUS | Status: DC
  Filled 2021-03-21: qty 200

## 2021-03-21 MED ORDER — ACETAMINOPHEN 325 MG PO TABS
650.0000 mg | ORAL_TABLET | Freq: Four times a day (QID) | ORAL | Status: DC | PRN
  Administered 2021-03-25: 17:00:00 650 mg via ORAL
  Filled 2021-03-21: qty 2

## 2021-03-21 MED ORDER — SODIUM CHLORIDE 0.9 % IV SOLN
2.0000 g | Freq: Once | INTRAVENOUS | Status: AC
  Administered 2021-03-21: 12:00:00 2 g via INTRAVENOUS
  Filled 2021-03-21: qty 2

## 2021-03-21 MED ORDER — LACTATED RINGERS IV BOLUS (SEPSIS)
1000.0000 mL | Freq: Once | INTRAVENOUS | Status: AC
  Administered 2021-03-21: 12:00:00 1000 mL via INTRAVENOUS

## 2021-03-21 MED ORDER — ACETAMINOPHEN 650 MG RE SUPP: 650.0000 mg | Freq: Four times a day (QID) | RECTAL | Status: DC | PRN

## 2021-03-21 MED ORDER — DILTIAZEM LOAD VIA INFUSION
10.0000 mg | Freq: Once | INTRAVENOUS | Status: AC
  Administered 2021-03-21: 13:00:00 10 mg via INTRAVENOUS
  Filled 2021-03-21: qty 10

## 2021-03-21 MED ORDER — LACTATED RINGERS IV SOLN: INTRAVENOUS | Status: DC

## 2021-03-21 MED ORDER — METRONIDAZOLE 500 MG/100ML IV SOLN
500.0000 mg | Freq: Once | INTRAVENOUS | Status: AC
  Administered 2021-03-21: 12:00:00 500 mg via INTRAVENOUS
  Filled 2021-03-21: qty 100

## 2021-03-21 NOTE — Consult Note (Signed)
Reason for Consult:: SDH Referring Physician: Dr Heidi Bean is an 87 y.o. female.   HPI:  87 year old female with a history of COVID 2 weeks ago, atrial fibrillation on aspirin, 2 or 3 recent falls, dementia, and DNR who was brought in with altered mental status today.  She was febrile up to 103 and tachycardic.  Head CT showed a large left acute on chronic subdural hematoma neurosurgical evaluation was requested.  She was given a dose of Lovenox at 1619.  She is on aspirin.  She is unable to give adequate history  Past Medical History:  Diagnosis Date   Dementia ([place])    Depression    HTN (hypertension)    Seizures ([place])     History reviewed. No pertinent surgical history.  No Known Allergies  Social History   Tobacco Use   Smoking status: Never   Smokeless tobacco: Never  Substance Use Topics   Alcohol use: Never    History reviewed. No pertinent family history.   Review of Systems  Positive ROS: unable to obtain  All other systems have been reviewed and were otherwise negative with the exception of those mentioned in the HPI and as above.  Objective: Vital signs in last 24 hours: Temp:  [97.9 F (36.6 C)-98.7 F (37.1 C)] 97.9 F (36.6 C) (12/26 1625) Pulse Rate:  [59-181] 93 (12/26 1730) Resp:  [13-25] 17 (12/26 1730) BP: (132-163)/(69-113) 143/70 (12/26 1730) SpO2:  [96 %-100 %] 98 % (12/26 1730) Weight:  [74.1 kg] 74.1 kg (12/26 1200)  General Appearance: Opens eyes to voice and sometimes spontaneously, in no acute distress Head: Normocephalic, old left frontal cephalohematoma and scab over abrasion Eyes: PERRL, gaze conjugate     Neck: Supple, symmetrical, trachea midline Heart: irreg rhythm Abdomen: Soft Extremities: Extremities normal, atraumatic, no cyanosis or edema Pulses: 2+ and symmetric all extremities Skin: Skin color, texture, turgor normal, no rashes or lesions  NEUROLOGIC:   Mental status: Follows occasional simple commands, states  her age is 41, I do think she told me her name Motor Exam -right hemiparetic Sensory Exam -unable to assess Reflexes:  Coordination -decreased on the right Gait -unable to assess Balance -unable to assess Cranial Nerves: I: smell Not tested  II: visual acuity  OS: na    OD: na  II: visual fields   II: pupils Equal, round, reactive to light  III,VII: ptosis None  III,IV,VI: extraocular muscles  Full ROM  V: mastication Normal  V: facial light touch sensation  Normal  V,VII: corneal reflex  Present  VII: facial muscle function - upper  Normal  VII: facial muscle function - lower Normal  VIII: hearing Not tested  IX: soft palate elevation    IX,X: gag reflex Present  XI: trapezius strength  5/5  XI: sternocleidomastoid strength 5/5  XI: neck flexion strength  5/5  XII: tongue strength  Normal    Data Review Lab Results  Component Value Date   WBC 11.9 (H) 03/21/2021   HGB 11.4 (L) 03/21/2021   HCT 37.1 03/21/2021   MCV 77.5 (L) 03/21/2021   PLT 323 03/21/2021   Lab Results  Component Value Date   NA 133 (L) 03/21/2021   K 4.0 03/21/2021   CL 99 03/21/2021   CO2 22 03/21/2021   BUN 12 03/21/2021   CREATININE 0.85 03/21/2021   GLUCOSE 118 (H) 03/21/2021   Lab Results  Component Value Date   INR 1.1 03/21/2021    Radiology: CT HEAD  WO CONTRAST (5MM)  Result Date: 03/21/2021 CLINICAL DATA:  Provided history: Altered mental status, nontraumatic. EXAM: CT HEAD WITHOUT CONTRAST TECHNIQUE: Contiguous axial images were obtained from the base of the skull through the vertex without intravenous contrast. COMPARISON:  Prior head CT examinations 03/06/2021 and earlier. Brain MRI 05/22/2020. FINDINGS: Brain: There is a large mixed density subdural hematoma overlying the left cerebral hemisphere. The hematoma is predominantly intermediate in density, but does contain small volume acute hyperdense blood products. The hematoma measures up to 2 cm in thickness. There is significant  mass effect on the underlying left cerebral hemisphere with partial effacement of the left lateral and third ventricles and 15 mm rightward midline shift measured at the level of the septum pellucidum. There is asymmetric prominence of the right lateral ventricle compatible with ventricular entrapment. There is periventricular hypodensity likely reflecting transependymal flow of CSF. Additionally, there is suspected early rightward subfalcine herniation and partial effacement of the left basal cisterns. Background patchy and ill-defined hypoattenuation within the cerebral white matter, nonspecific but compatible with chronic small vessel ischemic disease. No definite acute demarcated cortical infarct is identified. No evidence of an intracranial mass. Vascular: Hyperdense vessel.  Atherosclerotic calcifications. Skull: Normal. Negative for fracture or focal lesion. Sinuses/Orbits: Visualized orbits show no acute finding. Trace mucosal thickening within the right maxillary sinus. Small fluid level within the right sphenoid sinus. Small mucous retention cyst within the left frontoethmoidal recess. These results were called by telephone at the time of interpretation on 03/21/2021 at 4:28 pm to provider [facility] , who verbally acknowledged these results. IMPRESSION: Large mixed density subdural hematoma overlying left cerebral hemisphere, measuring up to 2 cm in thickness. The hematoma is predominantly intermediate density and subacute in appearance. However, the hematoma does contain small volume acute hemorrhage. There is significant mass effect upon the underlying left cerebral hemisphere with partial effacement of the left lateral and third ventricles and 15 mm rightward midline shift. Entrapment of the right lateral ventricle with associated transependymal flow of CSF. Additionally, there is early rightward subfalcine herniation and early effacement of the left basal cisterns. Background chronic small-vessel  ischemic changes within the cerebral white matter. Electronically Signed   By: Kellie Simmering D.O.   On: 03/21/2021 16:31   DG Chest Port 1 View  Result Date: 03/21/2021 CLINICAL DATA:  Questionable sepsis. EXAM: PORTABLE CHEST 1 VIEW COMPARISON:  05/22/2020 FINDINGS: 1151 hours. The cardio pericardial silhouette is enlarged. Interstitial markings are diffusely coarsened with chronic features. The lungs are clear without focal pneumonia, edema, pneumothorax or pleural effusion. Bones are diffusely demineralized. Telemetry leads overlie the chest. IMPRESSION: Low volume film without acute cardiopulmonary findings. Electronically Signed   By: Misty Stanley M.D.   On: 03/21/2021 12:08     Assessment/Plan: Estimated body mass index is 25.59 kg/m as calculated from the following:   Height as of 02/13/21: 5\' 7"  (1.702 m).   Weight as of this encounter: 3.8 kg.   87 year old female with a history of dementia who lives in a nursing home who fell 2-3 times over the last 2 weeks, with altered mental status and fever and a large left chronic subdural hematoma.  I had a long discussion with the daughter, Heidi Bean, regarding the situation and our options.  We have talked about surgical options and typical outcomes and recovery times.  After this discussion and given then 30 minutes to discuss everything and call in the back, they have decided to honor her DNR and not pursue  aggressive intervention.  We think this is the correct decision.  We would recommend comfort care.  Prognosis for excellent functional recovery given her premorbid status is poor   Tia Alert 03/21/2021 5:41 PM

## 2021-03-21 NOTE — ED Notes (Signed)
Acquanetta Chain daughter [phone] requesting an update when one is available

## 2021-03-21 NOTE — Assessment & Plan Note (Addendum)
Concern for breakthrough seizure or silent seizure with AMS Head CT, keppra level, consulting neurology Continue IV keppra Seizure precautions

## 2021-03-21 NOTE — ED Provider Notes (Signed)
[facility] EMERGENCY DEPARTMENT Provider Note   CSN: [id number] Arrival date & time: 03/21/21  1131     History Chief Complaint  Patient presents with   Altered Mental Status    Heidi Bean is a 87 y.o. female.  Presenting to ER with concern for altered mental status, fever.  Per [facility] staff per EMS, patient has not been acting normal, more confused than normal.  EMS report patient tachycardic to 180, febrile to 103.  Given 700 mL by EMS.  History of dementia, limits history.  Additional history obtained from family via phone.  States patient is to be DNR if heart stops, would potentially be open to intubation for temporary period.  HPI     Past Medical History:  Diagnosis Date   Dementia ([facility])    Depression    HTN (hypertension)    Seizures ([facility])     Patient Active Problem List   Diagnosis Date Noted   Atrial fibrillation with RVR ([facility]) 03/21/2021   COVID-19 virus infection 03/21/2021   Elevated troponin 03/21/2021   Hyponatremia 03/21/2021   AMS (altered mental status) 05/21/2020   Seizure disorder (HCC) 01/13/2019   Dementia (HCC) 09/08/2017   Essential hypertension 09/08/2017   Hypothyroidism 09/08/2017    History reviewed. No pertinent surgical history.   OB History   No obstetric history on file.     No family history on file.  Social History   Tobacco Use   Smoking status: Never   Smokeless tobacco: Never  Substance Use Topics   Alcohol use: Never   Drug use: Never    Home Medications Prior to Admission medications   Medication Sig Start Date End Date Taking? Authorizing Provider  acetaminophen (TYLENOL) 325 MG tablet Take 650 mg by mouth every 4 (four) hours as needed for mild pain, fever or headache.   Yes [provider]  aspirin EC 81 MG EC tablet Take 1 tablet (81 mg total) by mouth daily. Swallow whole. 05/24/20  Yes Pokhrel, Laxman, MD  calcium-vitamin D (OSCAL WITH D) 500-200 MG-UNIT TABS tablet Take  1 tablet by mouth daily.   Yes [provider]  Cholecalciferol (VITAMIN D) 50 MCG (2000 UT) tablet Take 2,000 Units by mouth 2 (two) times daily.   Yes [provider]  donepezil (ARICEPT) 10 MG tablet Take 10 mg by mouth daily. 12/23/19  Yes [provider]  escitalopram (LEXAPRO) 10 MG tablet Take 10 mg by mouth daily. 01/07/20  Yes [provider]  gabapentin (NEURONTIN) 300 MG capsule Take 300 mg by mouth 3 (three) times daily. 01/07/20  Yes [provider]  hydrochlorothiazide (HYDRODIURIL) 12.5 MG tablet Take 12.5 mg by mouth daily.   Yes [provider]  levETIRAcetam (KEPPRA) 750 MG tablet Take 750 mg by mouth 2 (two) times daily. 02/19/20  Yes [provider]  levothyroxine (SYNTHROID) 25 MCG tablet Take 25 mcg by mouth daily. 02/20/20  Yes [provider]  losartan (COZAAR) 50 MG tablet Take 50 mg by mouth daily. 12/15/19  Yes [provider]  melatonin 5 MG TABS Take 5 mg by mouth at bedtime.   Yes [provider]  metoprolol tartrate (LOPRESSOR) 25 MG tablet Take 1 tablet (25 mg total) by mouth 2 (two) times daily. 05/24/20 05/24/21 Yes Pokhrel, Laxman, MD  Multiple Vitamins-Minerals (PRESERVISION AREDS 2+MULTI VIT) CAPS Take 1 capsule by mouth in the morning and at bedtime.   Yes [provider]  nystatin (MYCOSTATIN) 100000 UNIT/ML  suspension Take 5 mLs by mouth 4 (four) times daily. SWISH AND SPIT   Yes [provider]  nystatin cream (MYCOSTATIN) Apply to affected area 2 times daily (rash on abdomen) 04/19/20  Yes Linwood DibblesKnapp, Jon, MD  sodium chloride 1 g tablet Take 1 g by mouth 3 (three) times daily. 02/19/20  Yes [provider]  sodium fluoride (PREVIDENT 5000 PLUS) 1.1 % CREA dental cream Place 1 application onto teeth every evening.   Yes [provider]    Allergies    Patient has no known allergies.  Review of Systems   Review of Systems  Unable to perform  ROS: Dementia   Physical Exam Updated Vital Signs BP 138/73    Pulse 90    Temp 98.7 F (37.1 C) (Rectal)    Resp 18    Wt 74.1 kg Comment: On paperwork that came with patient   SpO2 98%    BMI 25.59 kg/m   Physical Exam HENT:     Head: Normocephalic and atraumatic.     Nose: Nose normal.     Mouth/Throat:     Mouth: Mucous membranes are moist.  Eyes:     Pupils: Pupils are equal, round, and reactive to light.  Cardiovascular:     Rate and Rhythm: Tachycardia present. Rhythm irregular.  Pulmonary:     Comments: Mildly tachypneic but not in distress Abdominal:     General: Abdomen is flat.  Musculoskeletal:        General: No deformity or signs of injury.  Skin:    Capillary Refill: Capillary refill takes less than 2 seconds.     Findings: No bruising or erythema.  Neurological:     Mental Status: She is alert.     Comments: Alert, answers basic questions but struggles with any complex questioning    ED Results / Procedures / Treatments   Labs (all labs ordered are listed, but only abnormal results are displayed) Labs Reviewed  RESP PANEL BY RT-PCR (FLU A&B, COVID) ARPGX2 - Abnormal; Notable for the following components:      Result Value   SARS Coronavirus 2 by RT PCR POSITIVE (*)    All other components within normal limits  LACTIC ACID, PLASMA - Abnormal; Notable for the following components:   Lactic Acid, Venous 2.6 (*)    All other components within normal limits  COMPREHENSIVE METABOLIC PANEL - Abnormal; Notable for the following components:   Sodium 133 (*)    Glucose, Bld 118 (*)    Calcium 8.7 (*)    Albumin 3.3 (*)    Total Bilirubin 2.3 (*)    All other components within normal limits  CBC WITH DIFFERENTIAL/PLATELET - Abnormal; Notable for the following components:   WBC 11.9 (*)    Hemoglobin 11.4 (*)    MCV 77.5 (*)    MCH 23.8 (*)    RDW 19.1 (*)    Neutro Abs 10.0 (*)    Abs Immature Granulocytes 0.08 (*)    All other components within normal  limits  URINALYSIS, ROUTINE W REFLEX MICROSCOPIC - Abnormal; Notable for the following components:   Ketones, ur 40 (*)    Protein, ur 30 (*)    All other components within normal limits  CBG MONITORING, ED - Abnormal; Notable for the following components:   Glucose-Capillary 117 (*)    All other components within normal limits  CBG MONITORING, ED - Abnormal; Notable for the following components:   Glucose-Capillary 137 (*)  All other components within normal limits  TROPONIN I (HIGH SENSITIVITY) - Abnormal; Notable for the following components:   Troponin I (High Sensitivity) 42 (*)    All other components within normal limits  CULTURE, BLOOD (ROUTINE X 2)  CULTURE, BLOOD (ROUTINE X 2)  URINE CULTURE  PROTIME-INR  MAGNESIUM  URINALYSIS, MICROSCOPIC (REFLEX)  LACTIC ACID, PLASMA  SEDIMENTATION RATE  C-REACTIVE PROTEIN  TSH  AMMONIA  VITAMIN B12  LEVETIRACETAM LEVEL  TROPONIN I (HIGH SENSITIVITY)    EKG EKG Interpretation  Date/Time:  Monday March 21 2021 11:48:19 EST Ventricular Rate:  182 PR Interval:  84 QRS Duration: 71 QT Interval:  292 QTC Calculation: 509 R Axis:   -55 Text Interpretation: Supraventricular tachycardia Left anterior fascicular block Anteroseptal infarct, old Repolarization abnormality, prob rate related Confirmed by Madalyn Rob [phone]) on 03/21/2021 11:49:15 AM  Radiology DG Chest Port 1 View  Result Date: 03/21/2021 CLINICAL DATA:  Questionable sepsis. EXAM: PORTABLE CHEST 1 VIEW COMPARISON:  05/22/2020 FINDINGS: 1151 hours. The cardio pericardial silhouette is enlarged. Interstitial markings are diffusely coarsened with chronic features. The lungs are clear without focal pneumonia, edema, pneumothorax or pleural effusion. Bones are diffusely demineralized. Telemetry leads overlie the chest. IMPRESSION: Low volume film without acute cardiopulmonary findings. Electronically Signed   By: Misty Stanley M.D.   On: 03/21/2021 12:08     Procedures .Critical Care Performed by: Lucrezia Starch, MD Authorized by: Lucrezia Starch, MD   Critical care provider statement:    Critical care time (minutes):  35   Critical care was time spent personally by me on the following activities:  Development of treatment plan with patient or surrogate, discussions with consultants, evaluation of patient's response to treatment, examination of patient, ordering and review of laboratory studies, ordering and review of radiographic studies, ordering and performing treatments and interventions, pulse oximetry, re-evaluation of patient's condition and review of old charts   Medications Ordered in ED Medications  lactated ringers infusion ( Intravenous New Bag/Given 03/21/21 1218)  acetaminophen (TYLENOL) suppository 650 mg (has no administration in time range)  diltiazem (CARDIZEM) 125 mg in dextrose 5% 125 mL (1 mg/mL) infusion (7.5 mg/hr Intravenous Rate/Dose Change 03/21/21 1448)  ceFEPIme (MAXIPIME) 2 g in sodium chloride 0.9 % 100 mL IVPB (has no administration in time range)  vancomycin (VANCOCIN) IVPB 1000 mg/200 mL premix (has no administration in time range)  enoxaparin (LOVENOX) injection 40 mg (has no administration in time range)  acetaminophen (TYLENOL) tablet 650 mg (has no administration in time range)    Or  acetaminophen (TYLENOL) suppository 650 mg (has no administration in time range)  lactated ringers bolus 1,000 mL (0 mLs Intravenous Stopped 03/21/21 1322)  ceFEPIme (MAXIPIME) 2 g in sodium chloride 0.9 % 100 mL IVPB (0 g Intravenous Stopped 03/21/21 1256)  metroNIDAZOLE (FLAGYL) IVPB 500 mg (0 mg Intravenous Stopped 03/21/21 1322)  vancomycin (VANCOREADY) IVPB 1500 mg/300 mL (0 mg Intravenous Stopped 03/21/21 1453)  diltiazem (CARDIZEM) 1 mg/mL load via infusion 10 mg (10 mg Intravenous Bolus from Bag 03/21/21 1304)    ED Course  I have reviewed the triage vital signs and the nursing notes.  Pertinent labs &  imaging results that were available during my care of the patient were reviewed by me and considered in my medical decision making (see chart for details).    MDM Rules/Calculators/A&P  86 year old lady presented to ER from [facility] with concern for change in mental status.  Tachycardic to 180, febrile to 103 per EMS but afebrile here.  Initial concern for sepsis, obtain broad work-up, gave.  Antibiotics and some additional fluids.  COVID positive.  Remainder of work-up grossly stable.  Suspect COVID causing worsening of her atrial fibrillation.  No improvement in heart rate after fluids and antipyretic.  Initiated diltiazem drip for rate control, admitted to medicine for further management.  Final Clinical Impression(s) / ED Diagnoses Final diagnoses:  Atrial fibrillation with RVR [facility])    Rx / DC Orders ED Discharge Orders          Ordered    Amb referral to AFIB Clinic        03/21/21 1526             Lucrezia Starch, MD 03/21/21 1556

## 2021-03-21 NOTE — Assessment & Plan Note (Signed)
Has been well controlled, tolerating cardene drip and will likely discontinue for full comfort measures

## 2021-03-21 NOTE — ED Notes (Signed)
Son/court appointed guardian Blaine Hamper [phone] would like an update and called when admitted upstairs

## 2021-03-21 NOTE — Assessment & Plan Note (Addendum)
-  likely secondary to fall on 12/11 -neurosurgery urgently consulted who discussed with family. No intervention planned at this time.  -DNR with moving toward comfort measures palliative care consulted  Will stop ordered tests, continue IV antibiotics, cardizem drip and comfort care for now.

## 2021-03-21 NOTE — Progress Notes (Signed)
Elink following for sepsis protocol. 

## 2021-03-21 NOTE — Assessment & Plan Note (Signed)
Incidental finding Tested positive on 03/03/21 and received molpanuvir. Recovered and was doing fine Near 21 day mark, continue precautions for now.

## 2021-03-21 NOTE — Assessment & Plan Note (Signed)
87 year old presenting with altered mental status and found to be in afib with RVR -Patient presenting with encephalopathy as evidenced by her obtunded/unresponsiveness -While the patient does have underlying dementia, this is a change compared to her usual baseline mental status after talking with family  -check head CT -history of silent seizures and concern for this. Checking keppra level and consulting neurology  -infectious work up pending, met sepsis criteria with fever with EMS to 103, tachycardia and elevated WBC. Continue broad spectrum abx for now and tailor as indicated.  -metabolic labs -covid +, but initial infection on 12/8 and recovered -head CT resulted which shows large subdural hematoma with significant mass effect with suspected early herniation. Likely cause of AMS. Neurosurgery consulted and discussed with family. No plans for intervention with plans for comfort care.

## 2021-03-21 NOTE — ED Notes (Signed)
Per SW staff at [facility], pts discharge date for facility was today, and pt is not to return to [facility] upon discharge, instead is to go to [facility] in [place].

## 2021-03-21 NOTE — Assessment & Plan Note (Addendum)
Presenting with SVT vs. RVR with rate to 182, repeat ekg atrial fibrillation History of PAF, on no DOAC cardizem drip, tolerating well Continue drip and wean as tolerated Checking echo/inflammatory markers with recent covid infection.

## 2021-03-21 NOTE — Assessment & Plan Note (Signed)
Delirium precautions 

## 2021-03-21 NOTE — Progress Notes (Signed)
WHEN PATIENT IS DISCHARGED SHE WILL NEED TO GO TO [facility] IN [place] PER [facility], SONYA WILLIAMS. Relative Glynn Octave will be taking patient to [facility]. Please call Glynn Octave listed in the chart

## 2021-03-21 NOTE — Assessment & Plan Note (Signed)
Check TSH with afib with RVR Continue synthroid

## 2021-03-21 NOTE — Assessment & Plan Note (Addendum)
Likely secondary to hypovolemia hyponatremia and ?SIADH as on 1g salt tablet TID in MAR. bolused and maintenance IVF which I stopped later on in evening post discussion for goals of care.

## 2021-03-21 NOTE — Progress Notes (Signed)
CT critical read with large subdural hematoma with early herniation. Discussed with family and urgently called neurosurgery who will see patient and call family with updates.  Dr. Artis Flock [facility]

## 2021-03-21 NOTE — H&P (Addendum)
History and Physical    Heidi Bean DJS:[id number] DOB: [birth year] DOA: 03/21/2021  PCP: Seward Carol, MD Consultants:  neurology: Dr. Jannifer Franklin  Patient [facility]: [facility]    Chief Complaint: rapid heart rate   HPI: Heidi Bean is a 87 y.o. female with medical history significant of dementia, depression, HTN, PAF with no anticoagulation and seizures presenting with fever and rapid heart rate. Patient has dementia so no history is obtained from her. Recent covid infection on 12/8 for which she received molnupiravir. History obtained from her daughter, Heidi Bean  Baseline: will talk and interact with everyone. Doesn't always remember her son, but knows her daughter. Remembers birthdays. Sometimes will report she is hurting, but not all of the time. Her not responding is abnormal from her baseline.   Daughter is not here at this time. She tried to talk to her on Christmas Day and she was completely altered from her baseline and they thought it was because she was tired. This morning she was called by SNF and was told her heart rate was elevated and sending her to hospital. Per EMS patient had temperature of 103 ; however, no fever here and no fever at SNF. EMS also placed on her 3L oxygen, but has never required oxygen here.   ROS can not be obtained.   5 moderna covid vaccines and her flu shot.   ED Course: vitals: afebrile, bp: 160/110, HR: 180, RR: 21, oxygen: 99% room air Pertinent labs: wbc: 11.9, hgb: 11.4, sodium: 133, total bili: 2.3, lactic acid: 2.6>pending, troponin: 42>pending, COVID positive (had covid 12/8)  CXR: no acute finding In ED: code sepsis activated. Cultures obtained. Received 1L bolus and on maintenance fluids, given broad spectrum abx and started on cardizem drip. Covid came back positive which is likely driving afib/sepsis picture. [facility] was asked to admit.   Review of Systems: can not be obtained.   Ambulatory Status:  Ambulates with walker, but  now unsure if she can safely ambulate    Past Medical History:  Diagnosis Date   Dementia ([place])    Depression    HTN (hypertension)    Seizures ([place])     History reviewed. No pertinent surgical history.  Social History   Socioeconomic History   Marital status: Widowed    Spouse name: Not on file   Number of children: Not on file   Years of education: Not on file   Highest education level: Not on file  Occupational History   Not on file  Tobacco Use   Smoking status: Never   Smokeless tobacco: Never  Substance and Sexual Activity   Alcohol use: Never   Drug use: Never   Sexual activity: Not on file  Other Topics Concern   Not on file  Social History Narrative   Not on file   Social Determinants of Health   Financial Resource Strain: Not on file  Food Insecurity: Not on file  Transportation Needs: Not on file  Physical Activity: Not on file  Stress: Not on file  Social Connections: Not on file  Intimate Partner Violence: Not on file    No Known Allergies  History reviewed. No pertinent family history.  Prior to Admission medications   Medication Sig Start Date End Date Taking? Authorizing Provider  acetaminophen (TYLENOL) 325 MG tablet Take 650 mg by mouth every 4 (four) hours as needed for mild pain, fever or headache.   Yes [provider]  aspirin EC 81 MG EC tablet Take  1 tablet (81 mg total) by mouth daily. Swallow whole. 05/24/20  Yes Pokhrel, Laxman, MD  calcium-vitamin D (OSCAL WITH D) 500-200 MG-UNIT TABS tablet Take 1 tablet by mouth daily.   Yes [provider]  Cholecalciferol (VITAMIN D) 50 MCG (2000 UT) tablet Take 2,000 Units by mouth 2 (two) times daily.   Yes [provider]  donepezil (ARICEPT) 10 MG tablet Take 10 mg by mouth daily. 12/23/19  Yes [provider]  escitalopram (LEXAPRO) 10 MG tablet Take 10 mg by mouth daily. 01/07/20  Yes [provider]  gabapentin (NEURONTIN) 300 MG capsule Take  300 mg by mouth 3 (three) times daily. 01/07/20  Yes [provider]  hydrochlorothiazide (HYDRODIURIL) 12.5 MG tablet Take 12.5 mg by mouth daily.   Yes [provider]  levETIRAcetam (KEPPRA) 750 MG tablet Take 750 mg by mouth 2 (two) times daily. 02/19/20  Yes [provider]  levothyroxine (SYNTHROID) 25 MCG tablet Take 25 mcg by mouth daily. 02/20/20  Yes [provider]  losartan (COZAAR) 50 MG tablet Take 50 mg by mouth daily. 12/15/19  Yes [provider]  melatonin 5 MG TABS Take 5 mg by mouth at bedtime.   Yes [provider]  metoprolol tartrate (LOPRESSOR) 25 MG tablet Take 1 tablet (25 mg total) by mouth 2 (two) times daily. 05/24/20 05/24/21 Yes Pokhrel, Laxman, MD  Multiple Vitamins-Minerals (PRESERVISION AREDS 2+MULTI VIT) CAPS Take 1 capsule by mouth in the morning and at bedtime.   Yes [provider]  nystatin (MYCOSTATIN) 100000 UNIT/ML suspension Take 5 mLs by mouth 4 (four) times daily. SWISH AND SPIT   Yes [provider]  nystatin cream (MYCOSTATIN) Apply to affected area 2 times daily (rash on abdomen) 04/19/20  Yes Dorie Rank, MD  sodium chloride 1 g tablet Take 1 g by mouth 3 (three) times daily. 02/19/20  Yes [provider]  sodium fluoride (PREVIDENT 5000 PLUS) 1.1 % CREA dental cream Place 1 application onto teeth every evening.   Yes [provider]    Physical Exam: Vitals:   03/21/21 1800 03/21/21 1815 03/21/21 1830 03/21/21 1845  BP: (!) 144/65 (!) 143/76 135/73 (!) 144/72  Pulse: 94 91 92 100  Resp: 17 (!) _0 Temp:      TempSrc:      SpO2: 98% 98% 98% 98%  Weight:         General:  Appears calm and comfortable and is in NAD. Resting, opens eyes. Does not talk or answer questions. Large swelling on left forehead.  Eyes:  PERRL, EOMI, normal lids, iris ENT:  appears intact. lips & tongue, mmm; would not open mouth  Neck:  no LAD, masses or thyromegaly; no  carotid bruits Cardiovascular: irregularly, irregular, no m/r/g. No LE edema.  Respiratory:   CTA bilaterally with no wheezes/rales/rhonchi.  Normal respiratory effort. Abdomen:  soft, NT, ND, NABS Back:   normal alignment, no CVAT Skin:  no rash or induration seen on limited exam Musculoskeletal:  could not assess, would not participate.  no bony abnormality Lower extremity:  No LE edema.  Limited foot exam with no ulcerations.  2+ distal pulses. Psychiatric:  sleeping, would not talk to me.  Neurologic:  limited exam, sleeping/nonresponsive      Radiological Exams on Admission: Independently reviewed - see discussion in A/P where applicable  CT HEAD WO CONTRAST (5MM)  Result Date: 03/21/2021 CLINICAL DATA:  Provided history: Altered mental status, nontraumatic. EXAM: CT  HEAD WITHOUT CONTRAST TECHNIQUE: Contiguous axial images were obtained from the base of the skull through the vertex without intravenous contrast. COMPARISON:  Prior head CT examinations 03/06/2021 and earlier. Brain MRI 05/22/2020. FINDINGS: Brain: There is a large mixed density subdural hematoma overlying the left cerebral hemisphere. The hematoma is predominantly intermediate in density, but does contain small volume acute hyperdense blood products. The hematoma measures up to 2 cm in thickness. There is significant mass effect on the underlying left cerebral hemisphere with partial effacement of the left lateral and third ventricles and 15 mm rightward midline shift measured at the level of the septum pellucidum. There is asymmetric prominence of the right lateral ventricle compatible with ventricular entrapment. There is periventricular hypodensity likely reflecting transependymal flow of CSF. Additionally, there is suspected early rightward subfalcine herniation and partial effacement of the left basal cisterns. Background patchy and ill-defined hypoattenuation within the cerebral white matter, nonspecific but compatible  with chronic small vessel ischemic disease. No definite acute demarcated cortical infarct is identified. No evidence of an intracranial mass. Vascular: Hyperdense vessel.  Atherosclerotic calcifications. Skull: Normal. Negative for fracture or focal lesion. Sinuses/Orbits: Visualized orbits show no acute finding. Trace mucosal thickening within the right maxillary sinus. Small fluid level within the right sphenoid sinus. Small mucous retention cyst within the left frontoethmoidal recess. These results were called by telephone at the time of interpretation on 03/21/2021 at 4:28 pm to provider [facility] , who verbally acknowledged these results. IMPRESSION: Large mixed density subdural hematoma overlying left cerebral hemisphere, measuring up to 2 cm in thickness. The hematoma is predominantly intermediate density and subacute in appearance. However, the hematoma does contain small volume acute hemorrhage. There is significant mass effect upon the underlying left cerebral hemisphere with partial effacement of the left lateral and third ventricles and 15 mm rightward midline shift. Entrapment of the right lateral ventricle with associated transependymal flow of CSF. Additionally, there is early rightward subfalcine herniation and early effacement of the left basal cisterns. Background chronic small-vessel ischemic changes within the cerebral white matter. Electronically Signed   By: Kellie Simmering D.O.   On: 03/21/2021 16:31   DG Chest Port 1 View  Result Date: 03/21/2021 CLINICAL DATA:  Questionable sepsis. EXAM: PORTABLE CHEST 1 VIEW COMPARISON:  05/22/2020 FINDINGS: 1151 hours. The cardio pericardial silhouette is enlarged. Interstitial markings are diffusely coarsened with chronic features. The lungs are clear without focal pneumonia, edema, pneumothorax or pleural effusion. Bones are diffusely demineralized. Telemetry leads overlie the chest. IMPRESSION: Low volume film without acute cardiopulmonary  findings. Electronically Signed   By: Misty Stanley M.D.   On: 03/21/2021 12:08    EKG: Independently reviewed. Sinus tachy with rate 182; nonspecific ST changes with no evidence of acute ischemia. Repeat ekg a fib with rate of 92    Labs on Admission: I have personally reviewed the available labs and imaging studies at the time of the admission.  Pertinent labs:   wbc: 11.9,  hgb: 11.4,  sodium: 133,  total bili: 2.3,  lactic acid: 2.6>pending,  troponin: 42>pending,  COVID positive-->had covid on 12/8  Assessment/Plan AMS (altered mental status) 87 year old presenting with altered mental status and found to be in afib with RVR -Patient presenting with encephalopathy as evidenced by her obtunded/unresponsiveness -While the patient does have underlying dementia, this is a change compared to her usual baseline mental status after talking with family  -check head CT -history of silent seizures and concern for this. Checking keppra level  and consulting neurology  -infectious work up pending, met sepsis criteria with fever with EMS to 103, tachycardia and elevated WBC. Continue broad spectrum abx for now and tailor as indicated.  -metabolic labs -covid +, but initial infection on 12/8 and recovered -head CT resulted which shows large subdural hematoma with significant mass effect with suspected early herniation. Likely cause of AMS. Neurosurgery consulted and discussed with family. No plans for intervention with plans for comfort care.   Subdural hematoma -likely secondary to fall on 12/11 -neurosurgery urgently consulted who discussed with family. No intervention planned at this time.  -DNR with moving toward comfort measures palliative care consulted, will see first thing in the AM  Will stop ordered tests, continue IV antibiotics, cardizem drip and comfort care for now.   Per family consulted chaplain as well   Atrial fibrillation with RVR ([place]) Presenting with SVT vs. RVR with  rate to 182, repeat ekg atrial fibrillation History of PAF, on no DOAC cardizem drip, tolerating well Continue drip and wean as tolerated Checking echo/inflammatory markers with recent covid infection. Hold echo with comfort care   seizure disorder [facility]) Concern for breakthrough seizure or silent seizure with AMS Head CT, keppra level, consulting neurology Continue IV keppra  Seizure precautions   COVID-19 virus infection Incidental finding Tested positive on 03/03/21 and received molpanuvir. Recovered and was doing fine Near 21 day mark, continue precautions for now.   Elevated troponin Likely demand ischemia in setting of afib with RVR Continue telemetry and monitoring.  Moving towards comfort measures   Hyponatremia Likely secondary to hypovolemia hyponatremia.and ? Hx of SIADH as on 1g sodium tablets in her MAR.  bolused and maintenance IVF which I stopped later on in evening.   Dementia ([place]) Delirium precautions  Essential hypertension Has been well controlled, tolerating cardene drip   Hypothyroidism Check TSH with afib with RVR Continue synthroid   Body mass index is 25.59 kg/m.   Level of care: Progressive DVT prophylaxis:  TED hose  Code Status: DNR Family Communication: None present; I spoke with the patient's daughter by telephone at the time of admission. Heidi Bean: [phone] Disposition Plan:  The patient is from: SNF  Anticipated d/c is to: hospice   Requires inpatient hospitalization and is at significant risk of neurological worsening, requires constant monitoring, assessment and MDM with specialists.    Patient is currently: poor  Consults called: neurology, Dr. Theda Sers and subsequently neurosurgery, Dr. Ronnald Ramp and palliative care consult. After CT findings and discussion with family/plan of care neurology no longer needs to consult on patient.  Admission status:  inpatient    Orma Flaming MD [facility]   How to contact the  [facility] Attending or Consulting provider [place] or covering provider during after hours [place], for this patient?  Check the care team in [facility] and look for a) attending/consulting [facility] provider listed and b) the [facility] team listed Log into [email or web address] and use [place]'s universal password to access. If you do not have the password, please contact the hospital operator. Locate the [facility] provider you are looking for under [facility] and page to a number that you can be directly reached. If you still have difficulty reaching the provider, please page the [facility] (Director on Call) for the Hospitalists listed on amion for assistance.   03/21/2021, 6:54 PM

## 2021-03-21 NOTE — Assessment & Plan Note (Addendum)
Likely demand ischemia in setting of afib with RVR Continue telemetry and monitoring.  Moving towards comfort measures

## 2021-03-21 NOTE — Progress Notes (Signed)
Pharmacy Antibiotic Note  Heidi Bean is a 87 y.o. female admitted on 03/21/2021 with sepsis.  Pharmacy has been consulted for vancomycin and cefepime dosing.  Patient with a history of dementia, depression, HTN, PAF with no anticoagulation and seizures. Patient presenting with rapid heart rate.  SCr 0.85 - at baseline WBC 11.9; LA 2.7; T 98.7 F  Plan: Cefepime 2g q12hr Vancomycin 1500 mg once then 1000 mg q24hr (eAUC 453) unless change in renal function Trend WBC, Fever, Renal function, & Clinical course F/u cultures, clinical course, WBC, fever De-escalate when able Levels at steady state  Weight: 74.1 kg (163 lb 6.4 oz) (On paperwork that came with patient)  Temp (24hrs), Avg:98.7 F (37.1 C), Min:98.7 F (37.1 C), Max:98.7 F (37.1 C)  Recent Labs  Lab 03/21/21 1147 03/21/21 1204  WBC  --  11.9*  CREATININE  --  0.85  LATICACIDVEN 2.6*  --     Estimated Creatinine Clearance: 48.1 mL/min (by C-G formula based on SCr of 0.85 mg/dL).    No Known Allergies  Antimicrobials this admission: cefepime 12/26 >>  vancomycin 12/26 >>  Metronidazole 12/26 x1 in ED  Microbiology results: Pending  Thank you for allowing pharmacy to be a part of this patients care.  Delmar Landau, PharmD, BCPS 03/21/2021 3:23 PM ED Clinical Pharmacist -  [phone]

## 2021-03-21 NOTE — ED Triage Notes (Signed)
Pt BIB [facility] from [facility]. Staff unable to provide much information, states that pt didn't take medications today and has not been acting like her normal self. Per EMS, pt febrile of 103F. HR 180 ST. CBG 146. SpO2 100% on 3L, does not wear O2 at facility. BP 160/110. Given NS by EMS. 18 L AC.

## 2021-03-22 DIAGNOSIS — G935 Compression of brain: Secondary | ICD-10-CM | POA: Diagnosis not present

## 2021-03-22 DIAGNOSIS — U071 COVID-19: Secondary | ICD-10-CM | POA: Diagnosis not present

## 2021-03-22 DIAGNOSIS — E8809 Other disorders of plasma-protein metabolism, not elsewhere classified: Secondary | ICD-10-CM | POA: Diagnosis present

## 2021-03-22 DIAGNOSIS — D509 Iron deficiency anemia, unspecified: Secondary | ICD-10-CM | POA: Diagnosis present

## 2021-03-22 DIAGNOSIS — Z7189 Other specified counseling: Secondary | ICD-10-CM

## 2021-03-22 DIAGNOSIS — E876 Hypokalemia: Secondary | ICD-10-CM | POA: Diagnosis present

## 2021-03-22 DIAGNOSIS — I248 Other forms of acute ischemic heart disease: Secondary | ICD-10-CM | POA: Diagnosis present

## 2021-03-22 DIAGNOSIS — I4891 Unspecified atrial fibrillation: Secondary | ICD-10-CM | POA: Diagnosis not present

## 2021-03-22 DIAGNOSIS — E039 Hypothyroidism, unspecified: Secondary | ICD-10-CM

## 2021-03-22 DIAGNOSIS — Z789 Other specified health status: Secondary | ICD-10-CM

## 2021-03-22 DIAGNOSIS — Z66 Do not resuscitate: Secondary | ICD-10-CM | POA: Diagnosis present

## 2021-03-22 DIAGNOSIS — Z515 Encounter for palliative care: Secondary | ICD-10-CM | POA: Diagnosis not present

## 2021-03-22 DIAGNOSIS — S065XAA Traumatic subdural hemorrhage with loss of consciousness status unknown, initial encounter: Secondary | ICD-10-CM

## 2021-03-22 DIAGNOSIS — I10 Essential (primary) hypertension: Secondary | ICD-10-CM

## 2021-03-22 DIAGNOSIS — R4182 Altered mental status, unspecified: Secondary | ICD-10-CM

## 2021-03-22 DIAGNOSIS — R778 Other specified abnormalities of plasma proteins: Secondary | ICD-10-CM

## 2021-03-22 DIAGNOSIS — E46 Unspecified protein-calorie malnutrition: Secondary | ICD-10-CM

## 2021-03-22 DIAGNOSIS — E871 Hypo-osmolality and hyponatremia: Secondary | ICD-10-CM

## 2021-03-22 DIAGNOSIS — E872 Acidosis, unspecified: Secondary | ICD-10-CM | POA: Diagnosis present

## 2021-03-22 DIAGNOSIS — F0390 Unspecified dementia without behavioral disturbance: Secondary | ICD-10-CM

## 2021-03-22 LAB — COMPREHENSIVE METABOLIC PANEL
ALT: 10 U/L (ref 0–44)
AST: 19 U/L (ref 15–41)
Albumin: 2.8 g/dL — ABNORMAL LOW (ref 3.5–5.0)
Alkaline Phosphatase: 85 U/L (ref 38–126)
Anion gap: 9 (ref 5–15)
BUN: 11 mg/dL (ref 8–23)
CO2: 22 mmol/L (ref 22–32)
Calcium: 8.1 mg/dL — ABNORMAL LOW (ref 8.9–10.3)
Chloride: 100 mmol/L (ref 98–111)
Creatinine, Ser: 0.72 mg/dL (ref 0.44–1.00)
GFR, Estimated: >60 mL/min (ref >60)
Glucose, Bld: 117 mg/dL — ABNORMAL HIGH (ref 70–99)
Potassium: 3.2 mmol/L — ABNORMAL LOW (ref 3.5–5.1)
Sodium: 131 mmol/L — ABNORMAL LOW (ref 135–145)
Total Bilirubin: 2.1 mg/dL — ABNORMAL HIGH (ref 0.3–1.2)
Total Protein: 6.1 g/dL — ABNORMAL LOW (ref 6.5–8.1)

## 2021-03-22 LAB — CBC
HCT: 31.4 % — ABNORMAL LOW (ref 36.0–46.0)
Hemoglobin: 10 g/dL — ABNORMAL LOW (ref 12.0–15.0)
MCH: 24.1 pg — ABNORMAL LOW (ref 26.0–34.0)
MCHC: 31.8 g/dL (ref 30.0–36.0)
MCV: 75.7 fL — ABNORMAL LOW (ref 80.0–100.0)
Platelets: 286 10*3/uL (ref 150–400)
RBC: 4.15 MIL/uL (ref 3.87–5.11)
RDW: 18.9 % — ABNORMAL HIGH (ref 11.5–15.5)
WBC: 11.6 10*3/uL — ABNORMAL HIGH (ref 4.0–10.5)
nRBC: 0 % (ref 0.0–0.2)

## 2021-03-22 LAB — URINE CULTURE: Culture: 1000 — AB

## 2021-03-22 MED ORDER — GLYCOPYRROLATE 1 MG PO TABS
1.0000 mg | ORAL_TABLET | ORAL | Status: DC | PRN
  Filled 2021-03-22: qty 1

## 2021-03-22 MED ORDER — LORAZEPAM 2 MG/ML IJ SOLN: 1.0000 mg | INTRAMUSCULAR | Status: DC | PRN

## 2021-03-22 MED ORDER — ONDANSETRON HCL 4 MG/2ML IJ SOLN
4.0000 mg | Freq: Four times a day (QID) | INTRAMUSCULAR | Status: DC | PRN
  Administered 2021-03-22: 13:00:00 4 mg via INTRAVENOUS
  Filled 2021-03-22: qty 2

## 2021-03-22 MED ORDER — LORAZEPAM 1 MG PO TABS: 1.0000 mg | ORAL_TABLET | ORAL | Status: DC | PRN

## 2021-03-22 MED ORDER — POLYVINYL ALCOHOL 1.4 % OP SOLN
1.0000 [drp] | Freq: Four times a day (QID) | OPHTHALMIC | Status: DC | PRN
  Filled 2021-03-22: qty 15

## 2021-03-22 MED ORDER — LORAZEPAM 2 MG/ML PO CONC: 1.0000 mg | ORAL | Status: DC | PRN

## 2021-03-22 MED ORDER — HALOPERIDOL LACTATE 5 MG/ML IJ SOLN: 0.5000 mg | INTRAMUSCULAR | Status: DC | PRN

## 2021-03-22 MED ORDER — MORPHINE SULFATE (PF) 2 MG/ML IV SOLN
1.0000 mg | INTRAVENOUS | Status: DC | PRN
  Administered 2021-03-22 – 2021-03-25 (×3): 2 mg via INTRAVENOUS
  Filled 2021-03-22 (×3): qty 1

## 2021-03-22 MED ORDER — BIOTENE DRY MOUTH MT LIQD
15.0000 mL | Freq: Two times a day (BID) | OROMUCOSAL | Status: DC
  Administered 2021-03-22 – 2021-03-25 (×5): 15 mL via TOPICAL

## 2021-03-22 MED ORDER — HALOPERIDOL 0.5 MG PO TABS
0.5000 mg | ORAL_TABLET | ORAL | Status: DC | PRN
  Filled 2021-03-22: qty 1

## 2021-03-22 MED ORDER — GLYCOPYRROLATE 0.2 MG/ML IJ SOLN: 0.2000 mg | INTRAMUSCULAR | Status: DC | PRN

## 2021-03-22 MED ORDER — GLYCOPYRROLATE 0.2 MG/ML IJ SOLN
0.2000 mg | INTRAMUSCULAR | Status: DC | PRN
  Administered 2021-03-22: 13:00:00 0.2 mg via INTRAVENOUS
  Filled 2021-03-22 (×2): qty 1

## 2021-03-22 MED ORDER — ONDANSETRON 4 MG PO TBDP: 4.0000 mg | ORAL_TABLET | Freq: Four times a day (QID) | ORAL | Status: DC | PRN

## 2021-03-22 MED ORDER — HALOPERIDOL LACTATE 2 MG/ML PO CONC
0.5000 mg | ORAL | Status: DC | PRN
  Filled 2021-03-22: qty 0.3

## 2021-03-22 NOTE — ED Notes (Signed)
Acquanetta Chain daughter [phone] requesting a call back

## 2021-03-22 NOTE — Consult Note (Addendum)
Consultation Note Date: 03/22/2021   Patient Name: Heidi Bean  DOB: Dec 07, 1930  MRN: [id number]  Age / Sex: 87 y.o., female  PCP: Seward Carol, MD Referring Physician: Patrecia Pour, MD  Reason for Consultation: Establishing goals of care, Non pain symptom management, Pain control, Psychosocial/spiritual support, and Terminal Care  HPI/Patient Profile: 87 y.o. female  with past medical history of dementia, depression, HTN, PAF with no anticoagulation, and seizures presented to ED on 03/21/21 from [facility] with AMS. Patient was admitted on 03/21/2021 with AMS, COVID infection, atrial fibrillation with RVR, subdural hematoma secondary to fall, seizure disorder. Neurosurgery was consulted - discussed surgical options with family. Family opted not to pursue surgical/aggressive intervention therefor comfort care was recommended. Prognosis for excellent functional recovery given current status is poor.    ED Course: vitals: afebrile, bp: 160/110, HR: 180, RR: 21, oxygen: 99% room air Pertinent labs: wbc: 11.9, hgb: 11.4, sodium: 133, total bili: 2.3, lactic acid: 2.6>pending, troponin: 42>pending, COVID positive (had covid 12/8)  CXR: no acute finding In ED: code sepsis activated. Cultures obtained. Received 1L bolus and on maintenance fluids, given broad spectrum abx and started on cardizem drip. Covid came back positive which is likely driving afib/sepsis picture. TRH was asked to admit.   Clinical Assessment and Goals of Care: I have reviewed medical records including EPIC notes, labs, and imaging. Received report from primary RN - no acute concerns.   Went to visit patient at bedside - no family/visitors present. Patient was lying in bed - she wakes to voice/gentle touch but promptly falls back asleep - she answers "yeah" to all questions. She is not able to participate in meaningful conversation. No signs  or non-verbal gestures of pain or discomfort noted. No respiratory distress, increased work of breathing, or secretions noted.   Met with daughter/Christina and son/Keith via phone  to discuss diagnosis, prognosis, GOC, EOL wishes, disposition, and options.  I introduced Palliative Medicine as specialized medical care for people living with serious illness. It focuses on providing relief from the symptoms and stress of a serious illness. The goal is to improve quality of life for both the patient and the family.  We discussed a brief life review of the patient as well as functional and nutritional status. Prior to hospitalization, patient was living at [facility]. Family express they were dissatisfied with care there and were planning on transferring her to a new facility very soon. Margreta Journey is currently in [place] with Lanny Hurst helping him pack for a move to [place] was moving to [place] to be close to patient. Unfortunately, they are both snowed/iced in and unable to travel at this time. They express sadness on patient's current acute medical condition and how sudden it was considering they are all in a transitional time to get her better care.   We discussed patient's current illness and what it means in the larger context of patient's on-going co-morbidities. Family have a clear understanding of patient's current acute medical situation. Margreta Journey shares that  patient has "been ready to go home to [place]." Natural disease trajectory and expectations at EOL were discussed. I attempted to elicit values and goals of care important to the patient. The difference between aggressive medical intervention and comfort care was considered in light of the patient's goals of care.   We talked about transition to comfort measures in house and what that would entail inclusive of medications to control pain, dyspnea, agitation, nausea, and itching. We discussed stopping all unnecessary measures such as blood  draws, needle sticks, oxygen, antibiotics, CBGs/insulin, cardiac monitoring, IVF, and frequent vital signs. Family are agreeable to full comfort measures. They do not want the patient to suffer and "want her to be comfortable."   Hospice were explained and offered. At this time, family do not wish for patient to be transferred to hospice facility.  Current COVID 19 [facility] visitation policy reviewed - she expressed understanding and appreciation.   Offered chaplain services. Family asked if priest could visit patient for Last Rights - will consult Chaplain for assistance.   Visit also consisted of discussions dealing with the complex and emotionally intense issues of symptom management and palliative care in the setting of serious and life-threatening illness. Palliative care team will continue to support patient, patient's family, and medical team.  Discussed with family the importance of continued conversation with each other and the medical providers regarding overall plan of care and treatment options, ensuring decisions are within the context of the patients values and GOCs.    Questions and concerns were addressed. The patient/family was encouraged to call with questions and/or concerns. PMT card was provided. Family expressed appreciation for medical team and call today.   Primary Decision Maker: NEXT OF KIN    SUMMARY OF RECOMMENDATIONS   Continue full comfort measures Continue DNR/DNI as previously documented Family do not want patient transferred to residential hospice facility at this time - anticipate hospital death - prognosis likely hours to days  Added orders for EOL symptom management and to reflect full comfort measures, as well as discontinued orders that were not focused on comfort Continue Keppra for seizure prophylaxis  Transfer to 6N Chaplain consulted for: family's request for [facility] to visit patient for Last Rites Unrestricted visitation orders were placed per  current [facility] EOL visitation policy  Nursing to provide frequent assessments and administer PRN medications as clinically necessary to ensure EOL comfort PMT will continue to follow and support holistically   Code Status/Advance Care Planning: DNR  Palliative Prophylaxis:  Aspiration, Bowel Regimen, Delirium Protocol, Frequent Pain Assessment, Oral Care, and Turn Reposition  Additional Recommendations (Limitations, Scope, Preferences): Full Comfort Care  Psycho-social/Spiritual:  Desire for further Chaplaincy support:yes Created space and opportunity for patient and family to express thoughts and feelings regarding patient's current medical situation.  Emotional support and therapeutic listening provided.  Prognosis:  Hours - Days  Discharge Planning: Anticipated Hospital Death      Primary Diagnoses: Present on Admission:  Atrial fibrillation with RVR ([place])  Dementia ([place])  Essential hypertension  Hypothyroidism  COVID-19 virus infection  Elevated troponin  Hyponatremia  AMS (altered mental status)  Subdural hematoma   I have reviewed the medical record, interviewed the patient and family, and examined the patient. The following aspects are pertinent.  Past Medical History:  Diagnosis Date   Dementia ([place])    Depression    HTN (hypertension)    Seizures ([place])    Social History   Socioeconomic History   Marital  status: Widowed    Spouse name: Not on file   Number of children: Not on file   Years of education: Not on file   Highest education level: Not on file  Occupational History   Not on file  Tobacco Use   Smoking status: Never   Smokeless tobacco: Never  Substance and Sexual Activity   Alcohol use: Never   Drug use: Never   Sexual activity: Not on file  Other Topics Concern   Not on file  Social History Narrative   Not on file   Social Determinants of Health   Financial Resource Strain: Not on file  Food Insecurity: Not on  file  Transportation Needs: Not on file  Physical Activity: Not on file  Stress: Not on file  Social Connections: Not on file   History reviewed. No pertinent family history. Scheduled Meds: Continuous Infusions:  ceFEPime (MAXIPIME) IV Stopped (03/22/21 0057)   diltiazem (CARDIZEM) infusion Stopped (03/22/21 0759)   levETIRAcetam 750 mg (03/22/21 0926)   vancomycin     PRN Meds:.acetaminophen, acetaminophen **OR** acetaminophen, morphine injection Medications Prior to Admission:  Prior to Admission medications   Medication Sig Start Date End Date Taking? Authorizing Provider  acetaminophen (TYLENOL) 325 MG tablet Take 650 mg by mouth every 4 (four) hours as needed for mild pain, fever or headache.   Yes [provider]  aspirin EC 81 MG EC tablet Take 1 tablet (81 mg total) by mouth daily. Swallow whole. 05/24/20  Yes Pokhrel, Laxman, MD  calcium-vitamin D (OSCAL WITH D) 500-200 MG-UNIT TABS tablet Take 1 tablet by mouth daily.   Yes [provider]  Cholecalciferol (VITAMIN D) 50 MCG (2000 UT) tablet Take 2,000 Units by mouth 2 (two) times daily.   Yes [provider]  donepezil (ARICEPT) 10 MG tablet Take 10 mg by mouth daily. 12/23/19  Yes [provider]  escitalopram (LEXAPRO) 10 MG tablet Take 10 mg by mouth daily. 01/07/20  Yes [provider]  gabapentin (NEURONTIN) 300 MG capsule Take 300 mg by mouth 3 (three) times daily. 01/07/20  Yes [provider]  hydrochlorothiazide (HYDRODIURIL) 12.5 MG tablet Take 12.5 mg by mouth daily.   Yes [provider]  levETIRAcetam (KEPPRA) 750 MG tablet Take 750 mg by mouth 2 (two) times daily. 02/19/20  Yes [provider]  levothyroxine (SYNTHROID) 25 MCG tablet Take 25 mcg by mouth daily. 02/20/20  Yes [provider]  losartan (COZAAR) 50 MG tablet Take 50 mg by mouth daily. 12/15/19  Yes [provider]  melatonin 5 MG TABS Take 5 mg by mouth at  bedtime.   Yes [provider]  metoprolol tartrate (LOPRESSOR) 25 MG tablet Take 1 tablet (25 mg total) by mouth 2 (two) times daily. 05/24/20 05/24/21 Yes Pokhrel, Laxman, MD  Multiple Vitamins-Minerals (PRESERVISION AREDS 2+MULTI VIT) CAPS Take 1 capsule by mouth in the morning and at bedtime.   Yes [provider]  nystatin (MYCOSTATIN) 100000 UNIT/ML suspension Take 5 mLs by mouth 4 (four) times daily. SWISH AND SPIT   Yes [provider]  nystatin cream (MYCOSTATIN) Apply to affected area 2 times daily (rash on abdomen) 04/19/20  Yes Dorie Rank, MD  sodium chloride 1 g tablet Take 1 g by mouth 3 (three) times daily. 02/19/20  Yes [provider]  sodium fluoride (PREVIDENT 5000 PLUS) 1.1 % CREA dental cream Place 1 application onto teeth every evening.   Yes [provider]   No Known  Allergies Review of Systems  Unable to perform ROS: Acuity of condition   Physical Exam Vitals and nursing note reviewed.  Constitutional:      General: She is not in acute distress.    Appearance: She is ill-appearing.  Pulmonary:     Effort: No respiratory distress.  Skin:    General: Skin is warm and dry.  Neurological:     Mental Status: She is unresponsive.     Motor: Weakness present.  Psychiatric:        Speech: She is noncommunicative.    Vital Signs: BP (!) 169/76    Pulse 77    Temp 97.9 F (36.6 C) (Oral)    Resp 18    Wt 74.1 kg Comment: On paperwork that came with patient   SpO2 98%    BMI 25.59 kg/m      Pain Score: 0-No pain   SpO2: SpO2: 98 % O2 Device:SpO2: 98 % O2 Flow Rate: .   IO: Intake/output summary:  Intake/Output Summary (Last 24 hours) at 03/22/2021 1024 Last data filed at 03/22/2021 0057 Gross per 24 hour  Intake 200 ml  Output --  Net 200 ml    LBM:   Baseline Weight: Weight: 74.1 kg (On paperwork that came with patient) Most recent weight: Weight: 74.1 kg (On paperwork that came with patient)     Palliative  Assessment/Data: PPS 10%     Time In: 1100 Time Out: 1215 Time Total: 75 minutes  Greater than 50%  of this time was spent counseling and coordinating care related to the above assessment and plan.  Signed by: Lin Landsman, NP   Please contact Palliative Medicine Team phone at [phone] for questions and concerns.  For individual provider: See Shea Evans

## 2021-03-22 NOTE — ED Notes (Signed)
Tallulah Hosman daughter [phone] requesting an update on the patient

## 2021-03-22 NOTE — ED Notes (Addendum)
Pt mentation continuing to decline. Julian Reil MD paged, requested for RN to get in touch with family to discuss option of repeat CT to potentially show progression of the hematoma. Family declined and requested to keep pt on comfort care. MD made aware

## 2021-03-22 NOTE — Progress Notes (Addendum)
[facility] PROGRESS NOTE ° °Heidi Bean  MRN:[id number] DOB: 04/28/1930 DOA: 03/21/2021 °PCP: Polite, Ronald, MD ° °Brief Narrative: °Heidi Bean is an 87 y.o. female with a history of atrial fibrillation on aspirin, dementia, falls, recent covid-19 (Dx 12/8, received molnupiravir), and seizure disorder who presented [facility] the ED 12[facility] from SNF. She had been falling more frequently over the past couple weeks and presented with altered mental status. She was reportedly febrile to 103°F and tachycardic with AFib w/RVR up to 180's, though afebrile here, tachycardic and hypertensive. WBC 11.9k, lactic acid 2.6. CXR with low volumes but no acute cardiopulmonary findings. Code sepsis was called. IV fluids and antibiotics were given, diltiazem infusion started, and patient admitted. CT of the head revealed a large left acute on chronic subdural hematoma with midline shift and early subfalcine herniation for which neurosurgery was consulted. After discussions, the family has opted not to pursue aggressive intervention. Palliative care has been involved in care and the patient will be admitted for comfort measures with prognosis estimated in hours-days. ° °Subjective: °Nearly unresponsive, doesn't open eyes ° °Objective: °BP (!) 147/99    Pulse (!) 103    Temp 97.9 °F (36.6 °C) (Oral)    Resp 19    Wt 74.1 kg Comment: On paperwork that came with patient   SpO2 99%    BMI 25.59 kg/m²   °Gen: Elderly female in no distress °Pulm: Clear and nonlabored  °CV: Rapid, irreg, no murmur, no JVD, no edema °GI: Soft, NT, ND, +BS  °Neuro: Minimally, nonverbally responsive to voice, blown right pupil, pinpoint left pupil °Ext: Warm, dry °Skin: Ecchymosis on left frontal scalp with central swelling. ° °Assessment & Plan: °Admission for end of life care: We will admit to med-surg/palliative, ideally on 6N. Palliative has met with the family by phone who are unfortunately snowed/iced-in in [place] and unable to travel here at  this time. They are aware of the poor prognosis including worsening mental status overnight with suspected herniation. They wish for the patient to be admitted to the hospital, not to attempt residential hospice placement.  °- Full comfort measures in place. Fortunately, the patient does not appear in any distress at this time.  °- DC medications not aimed at comfort for conditions listed below.  °- Chaplain consulted, we will have Last Rites today.  ° °Large acute on chronic subdural hematoma: No neurosurgical treatment will be pursued based on family discussion with Dr. Jones, neurosurgery. Of course, hold anticoagulation/antiplatelet. Worsening mental status with dilated right pupil this morning consistent with progressive herniation.  °- Continue keppra IV w/hx seizures and now SDH ° °Tested positive for covid-19 12/8, s/p antiviral Tx, no pulmonary findings. No longer suspect this is a symptomatic issue. No longer requires isolation or treatment for this. ° ° B , MD °[facility] °[email or web address] °03/22/2021, 12:34 PM   °

## 2021-03-22 NOTE — Progress Notes (Signed)
This chaplain responded to PMT consult for EOL spiritual care. The chaplain contacted Father Ree Kida per Amber-PMT NP request for Last Rites.   The chaplain understands Father Ree Kida will visit the Pt. room today at 2pm. This chaplain is planning to join Father Ree Kida.  Chaplain Stephanie Acre [phone]

## 2021-03-22 NOTE — ED Notes (Signed)
Pt responded to RN and reported that she was in pain. Pt remains increasingly lethargic

## 2021-03-22 NOTE — Progress Notes (Signed)
Patient arrived to 6 north room 29. Bed in lowest position call light in reach. Patient only alert to pain

## 2021-03-22 NOTE — ED Notes (Signed)
ED TO INPATIENT HANDOFF REPORT  ED Nurse Name and Phone #: Michell Heinrich [phone]  S Name/Age/Gender Heidi Bean 87 y.o. female Room/Bed: 025C/025C  Code Status   Code Status: DNR  Home/SNF/Other Home Pt disoriented x 4 Is this baseline? No   Triage Complete: Triage complete  Chief Complaint Atrial fibrillation with RVR (HCC) [I48.91] Subdural hematoma [S06.5XAA] Admission for end of life care [Z51.5]  Triage Note Pt BIB [facility] from [facility]. Staff unable to provide much information, states that pt didn't take medications today and has not been acting like her normal self. Per EMS, pt febrile of 103F. HR 180 ST. CBG 146. SpO2 100% on 3L, does not wear O2 at facility. BP 160/110. Given NS by EMS. 18 L AC.    Allergies No Known Allergies  Level of Care/Admitting Diagnosis ED Disposition     ED Disposition  Admit   Condition  --   Comment  Hospital Area: [facility] [[id number]]  Level of Care: Palliative Care [15]  May admit patient to [facility] or [facility] if equivalent level of care is available:: No  Covid Evaluation: Confirmed COVID Negative  Diagnosis: Admission for end of life care [287162]  Admitting Physician: Tyrone Nine [[id number]]  Attending Physician: Tyrone Nine [phone]  Estimated length of stay: past midnight tomorrow  Certification:: I certify this patient will need inpatient services for at least 2 midnights          B Medical/Surgery History Past Medical History:  Diagnosis Date   Dementia (HCC)    Depression    HTN (hypertension)    Seizures (HCC)    History reviewed. No pertinent surgical history.   A IV Location/Drains/Wounds Patient Lines/Drains/Airways Status     Active Line/Drains/Airways     Name Placement date Placement time Site Days   Peripheral IV 03/21/21 18 G Left Antecubital 03/21/21  0000  Antecubital  1   Peripheral IV 03/21/21 20 G Right Antecubital 03/21/21  1204  Antecubital  1   Peripheral  IV 03/21/21 22 G Left;Posterior Hand 03/21/21  1256  Hand  1   External Urinary Catheter 05/21/20  1900  --  305   External Urinary Catheter 02/13/21  0128  --  37   External Urinary Catheter 03/21/21  1151  --  1            Intake/Output Last 24 hours  Intake/Output Summary (Last 24 hours) at 03/22/2021 1233 Last data filed at 03/22/2021 1036 Gross per 24 hour  Intake 300 ml  Output --  Net 300 ml    Labs/Imaging Results for orders placed or performed during the hospital encounter of 03/21/21 (from the past 48 hour(s))  Resp Panel by RT-PCR (Flu A&B, Covid)     Status: Abnormal   Collection Time: 03/21/21 11:47 AM   Specimen: Nasopharyngeal(NP) swabs in vial transport medium  Result Value Ref Range   SARS Coronavirus 2 by RT PCR POSITIVE (A) NEGATIVE    Comment: (NOTE) SARS-CoV-2 target nucleic acids are DETECTED.  The SARS-CoV-2 RNA is generally detectable in upper respiratory specimens during the acute phase of infection. Positive results are indicative of the presence of the identified virus, but do not rule out bacterial infection or co-infection with other pathogens not detected by the test. Clinical correlation with patient history and other diagnostic information is necessary to determine patient infection status. The expected result is Negative.  Fact Sheet for Patients: BloggerCourse.com  Fact Sheet for Healthcare  Providers: IncredibleEmployment.be  This test is not yet approved or cleared by the [place] and  has been authorized for detection and/or diagnosis of SARS-CoV-2 by FDA under an Emergency Use Authorization (EUA).  This EUA will remain in effect (meaning this test can be used) for the duration of  the COVID-19 declaration under Section 564(b)(1) of the A ct, 21 U.S.C. section 360bbb-3(b)(1), unless the authorization is terminated or revoked sooner.     Influenza A by PCR NEGATIVE NEGATIVE    Influenza B by PCR NEGATIVE NEGATIVE    Comment: (NOTE) The Xpert Xpress SARS-CoV-2/FLU/RSV plus assay is intended as an aid in the diagnosis of influenza from Nasopharyngeal swab specimens and should not be used as a sole basis for treatment. Nasal washings and aspirates are unacceptable for Xpert Xpress SARS-CoV-2/FLU/RSV testing.  Fact Sheet for Patients: EntrepreneurPulse.com.au  Fact Sheet for Healthcare Providers: IncredibleEmployment.be  This test is not yet approved or cleared by the [place] FDA and has been authorized for detection and/or diagnosis of SARS-CoV-2 by FDA under an Emergency Use Authorization (EUA). This EUA will remain in effect (meaning this test can be used) for the duration of the COVID-19 declaration under Section 564(b)(1) of the Act, 21 U.S.C. section 360bbb-3(b)(1), unless the authorization is terminated or revoked.  Performed at [facility], [place] [address] [place], [place] [address]   Lactic acid, plasma     Status: Abnormal   Collection Time: 03/21/21 11:47 AM  Result Value Ref Range   Lactic Acid, Venous 2.6 (HH) 0.5 - 1.9 mmol/L    Comment: CRITICAL RESULT CALLED TO, READ BACK BY AND VERIFIED WITH: C COGAN RN BY SSTEPHENS [id number] Performed at [facility], [place] [address], [place], [place] [address]   Urine Culture     Status: None (Preliminary result)   Collection Time: 03/21/21 11:47 AM   Specimen: In/Out Cath Urine  Result Value Ref Range   Specimen Description IN/OUT CATH URINE    Special Requests NONE    Culture      CULTURE REINCUBATED FOR BETTER GROWTH Performed at [facility], [place] [address], [place], [place] [address]    Report Status PENDING   CBG monitoring, ED     Status: Abnormal   Collection Time: 03/21/21 11:55 AM  Result Value Ref Range   Glucose-Capillary 117 (H) 70 - 99 mg/dL    Comment: Glucose reference range applies only to samples taken after fasting  for at least 8 hours.   Comment 1 Notify RN    Comment 2 Document in Chart   Comprehensive metabolic panel     Status: Abnormal   Collection Time: 03/21/21 12:04 PM  Result Value Ref Range   Sodium 133 (L) 135 - 145 mmol/L   Potassium 4.0 3.5 - 5.1 mmol/L   Chloride 99 98 - 111 mmol/L   CO2 22 22 - 32 mmol/L   Glucose, Bld 118 (H) 70 - 99 mg/dL    Comment: Glucose reference range applies only to samples taken after fasting for at least 8 hours.   BUN 12 8 - 23 mg/dL   Creatinine, Ser 0.85 0.44 - 1.00 mg/dL   Calcium 8.7 (L) 8.9 - 10.3 mg/dL   Total Protein 7.1 6.5 - 8.1 g/dL   Albumin 3.3 (L) 3.5 - 5.0 g/dL   AST 21 15 - 41 U/L   ALT 11 0 - 44 U/L   Alkaline Phosphatase 101 38 - 126 U/L  Total Bilirubin 2.3 (H) 0.3 - 1.2 mg/dL   GFR, Estimated >60 >60 mL/min    Comment: (NOTE) Calculated using the CKD-EPI Creatinine Equation (2021)    Anion gap 12 5 - 15    Comment: Performed at [facility] [address] [place], [place] [address]  CBC WITH DIFFERENTIAL     Status: Abnormal   Collection Time: 03/21/21 12:04 PM  Result Value Ref Range   WBC 11.9 (H) 4.0 - 10.5 K/uL   RBC 4.79 3.87 - 5.11 MIL/uL   Hemoglobin 11.4 (L) 12.0 - 15.0 g/dL   HCT 37.1 36.0 - 46.0 %   MCV 77.5 (L) 80.0 - 100.0 fL   MCH 23.8 (L) 26.0 - 34.0 pg   MCHC 30.7 30.0 - 36.0 g/dL   RDW 19.1 (H) 11.5 - 15.5 %   Platelets 323 150 - 400 K/uL   nRBC 0.0 0.0 - 0.2 %   Neutrophils Relative % 84 %   Neutro Abs 10.0 (H) 1.7 - 7.7 K/uL   Lymphocytes Relative 6 %   Lymphs Abs 0.7 0.7 - 4.0 K/uL   Monocytes Relative 8 %   Monocytes Absolute 0.9 0.1 - 1.0 K/uL   Eosinophils Relative 0 %   Eosinophils Absolute 0.0 0.0 - 0.5 K/uL   Basophils Relative 1 %   Basophils Absolute 0.1 0.0 - 0.1 K/uL   Immature Granulocytes 1 %   Abs Immature Granulocytes 0.08 (H) 0.00 - 0.07 K/uL    Comment: Performed at [facility] [address], [place], [place] [address]  Protime-INR     Status: None    Collection Time: 03/21/21 12:04 PM  Result Value Ref Range   Prothrombin Time 14.2 11.4 - 15.2 seconds   INR 1.1 0.8 - 1.2    Comment: (NOTE) INR goal varies based on device and disease states. Performed at [facility], [place] [address] [place], [place] [address]   Blood Culture (routine x 2)     Status: None (Preliminary result)   Collection Time: 03/21/21 12:04 PM   Specimen: BLOOD  Result Value Ref Range   Specimen Description BLOOD RIGHT ANTECUBITAL    Special Requests      BOTTLES DRAWN AEROBIC AND ANAEROBIC Blood Culture results may not be optimal due to an inadequate volume of blood received in culture bottles   Culture      NO GROWTH < 24 HOURS Performed at [facility] [address] [place], [place]  [address]    Report Status PENDING   Magnesium     Status: None   Collection Time: 03/21/21 12:04 PM  Result Value Ref Range   Magnesium 1.7 1.7 - 2.4 mg/dL    Comment: Performed at [facility], [place] [address] [place], [place] [address]  Troponin I (High Sensitivity)     Status: Abnormal   Collection Time: 03/21/21 12:04 PM  Result Value Ref Range   Troponin I (High Sensitivity) 42 (H) <18 ng/L    Comment: (NOTE) Elevated high sensitivity troponin I (hsTnI) values and significant  changes across serial measurements may suggest ACS but many other  chronic and acute conditions are known to elevate hsTnI results.  Refer to the "Links" section for chest pain algorithms and additional  guidance. Performed at [facility], [place] [address], [place], [place] [address]   Blood Culture (routine x 2)     Status: None (Preliminary result)   Collection Time: 03/21/21  12:11 PM   Specimen: BLOOD RIGHT HAND  Result Value Ref Range   Specimen Description BLOOD RIGHT HAND    Special Requests      BOTTLES DRAWN AEROBIC AND ANAEROBIC Blood Culture results may not be optimal due to an inadequate volume of blood received in culture bottles   Culture      NO  GROWTH < 24 HOURS Performed at [facility] [address] [place], [place] [address]    Report Status PENDING   CBG monitoring, ED     Status: Abnormal   Collection Time: 03/21/21  1:22 PM  Result Value Ref Range   Glucose-Capillary 137 (H) 70 - 99 mg/dL    Comment: Glucose reference range applies only to samples taken after fasting for at least 8 hours.  Lactic acid, plasma     Status: Abnormal   Collection Time: 03/21/21  1:47 PM  Result Value Ref Range   Lactic Acid, Venous 2.7 (HH) 0.5 - 1.9 mmol/L    Comment: CRITICAL VALUE NOTED.  VALUE IS CONSISTENT WITH PREVIOUSLY REPORTED AND CALLED VALUE. Performed at [facility], [place] [address], [place], [place] [address]   Troponin I (High Sensitivity)     Status: Abnormal   Collection Time: 03/21/21  1:48 PM  Result Value Ref Range   Troponin I (High Sensitivity) 51 (H) <18 ng/L    Comment: (NOTE) Elevated high sensitivity troponin I (hsTnI) values and significant  changes across serial measurements may suggest ACS but many other  chronic and acute conditions are known to elevate hsTnI results.  Refer to the "Links" section for chest pain algorithms and additional  guidance. Performed at [facility], [place] [address] [place], [place] [address]   Urinalysis, Routine w reflex microscopic Urine, Clean Catch     Status: Abnormal   Collection Time: 03/21/21  2:03 PM  Result Value Ref Range   Color, Urine YELLOW YELLOW   APPearance CLEAR CLEAR   Specific Gravity, Urine 1.025 1.005 - 1.030   pH 6.5 5.0 - 8.0   Glucose, UA NEGATIVE NEGATIVE mg/dL   Hgb urine dipstick NEGATIVE NEGATIVE   Bilirubin Urine NEGATIVE NEGATIVE   Ketones, ur 40 (A) NEGATIVE mg/dL   Protein, ur 30 (A) NEGATIVE mg/dL   Nitrite NEGATIVE NEGATIVE   Leukocytes,Ua NEGATIVE NEGATIVE    Comment: Performed at [facility] [address] [place], [place] [address]  Urinalysis, Microscopic (reflex)     Status: None   Collection Time:  03/21/21  2:03 PM  Result Value Ref Range   RBC / HPF 0-5 0 - 5 RBC/hpf   WBC, UA 0-5 0 - 5 WBC/hpf   Bacteria, UA NONE SEEN NONE SEEN   Squamous Epithelial / LPF 0-5 0 - 5    Comment: Performed at [facility], [place] [address] [place], [place] [address]  Sedimentation rate     Status: None   Collection Time: 03/21/21  4:22 PM  Result Value Ref Range   Sed Rate 22 0 - 22 mm/hr    Comment: Performed at [facility] [address], [place], [place] [address]  C-reactive protein     Status: Abnormal   Collection Time: 03/21/21  4:22 PM  Result Value Ref Range   CRP 2.8 (H) <1.0 mg/dL    Comment: Performed at [facility], [place] [address] [place], [place] [address]  TSH     Status: None  Collection Time: 03/21/21  4:22 PM  Result Value Ref Range   TSH 2.771 0.350 - 4.500 uIU/mL    Comment: Performed by a 3rd Generation assay with a functional sensitivity of <=0.01 uIU/mL. Performed at [facility], [place] [address], [place], [place] [address]   Ammonia     Status: None   Collection Time: 03/21/21  4:22 PM  Result Value Ref Range   Ammonia 30 9 - 35 umol/L    Comment: Performed at [facility], [place] [address], [place], [place] [address]  Vitamin B12     Status: None   Collection Time: 03/21/21  4:22 PM  Result Value Ref Range   Vitamin B-12 297 180 - 914 pg/mL    Comment: (NOTE) This assay is not validated for testing neonatal or myeloproliferative syndrome specimens for Vitamin B12 levels. Performed at [facility], [place] [address], [place], [place] [address]   CBC     Status: Abnormal   Collection Time: 03/22/21  4:59 AM  Result Value Ref Range   WBC 11.6 (H) 4.0 - 10.5 K/uL   RBC 4.15 3.87 - 5.11 MIL/uL   Hemoglobin 10.0 (L) 12.0 - 15.0 g/dL   HCT 31.4 (L) 36.0 - 46.0 %   MCV 75.7 (L) 80.0 - 100.0 fL   MCH 24.1 (L) 26.0 - 34.0 pg   MCHC 31.8 30.0 - 36.0 g/dL   RDW 18.9 (H) 11.5 - 15.5 %   Platelets 286 150 - 400 K/uL   nRBC 0.0 0.0 -  0.2 %    Comment: Performed at [facility] [address] [place], [place] [address]  Comprehensive metabolic panel     Status: Abnormal   Collection Time: 03/22/21  4:59 AM  Result Value Ref Range   Sodium 131 (L) 135 - 145 mmol/L   Potassium 3.2 (L) 3.5 - 5.1 mmol/L   Chloride 100 98 - 111 mmol/L   CO2 22 22 - 32 mmol/L   Glucose, Bld 117 (H) 70 - 99 mg/dL    Comment: Glucose reference range applies only to samples taken after fasting for at least 8 hours.   BUN 11 8 - 23 mg/dL   Creatinine, Ser 0.72 0.44 - 1.00 mg/dL   Calcium 8.1 (L) 8.9 - 10.3 mg/dL   Total Protein 6.1 (L) 6.5 - 8.1 g/dL   Albumin 2.8 (L) 3.5 - 5.0 g/dL   AST 19 15 - 41 U/L   ALT 10 0 - 44 U/L   Alkaline Phosphatase 85 38 - 126 U/L   Total Bilirubin 2.1 (H) 0.3 - 1.2 mg/dL   GFR, Estimated >60 >60 mL/min    Comment: (NOTE) Calculated using the CKD-EPI Creatinine Equation (2021)    Anion gap 9 5 - 15    Comment: Performed at [facility], [place] [address], [place], [place] [address]   CT HEAD WO CONTRAST (5MM)  Result Date: 03/21/2021 CLINICAL DATA:  Provided history: Altered mental status, nontraumatic. EXAM: CT HEAD WITHOUT CONTRAST TECHNIQUE: Contiguous axial images were obtained from the base of the skull through the vertex without intravenous contrast. COMPARISON:  Prior head CT examinations 03/06/2021 and earlier. Brain MRI 05/22/2020. FINDINGS: Brain: There is a large mixed density subdural hematoma overlying the left cerebral hemisphere. The hematoma is predominantly intermediate in density, but does contain small volume acute hyperdense blood products. The hematoma measures up to 2 cm in thickness. There is significant mass effect on the  underlying left cerebral hemisphere with partial effacement of the left lateral and third ventricles and 15 mm rightward midline shift measured at the level of the septum pellucidum. There is asymmetric prominence of the right lateral ventricle compatible with  ventricular entrapment. There is periventricular hypodensity likely reflecting transependymal flow of CSF. Additionally, there is suspected early rightward subfalcine herniation and partial effacement of the left basal cisterns. Background patchy and ill-defined hypoattenuation within the cerebral white matter, nonspecific but compatible with chronic small vessel ischemic disease. No definite acute demarcated cortical infarct is identified. No evidence of an intracranial mass. Vascular: Hyperdense vessel.  Atherosclerotic calcifications. Skull: Normal. Negative for fracture or focal lesion. Sinuses/Orbits: Visualized orbits show no acute finding. Trace mucosal thickening within the right maxillary sinus. Small fluid level within the right sphenoid sinus. Small mucous retention cyst within the left frontoethmoidal recess. These results were called by telephone at the time of interpretation on 03/21/2021 at 4:28 pm to provider [facility] , who verbally acknowledged these results. IMPRESSION: Large mixed density subdural hematoma overlying left cerebral hemisphere, measuring up to 2 cm in thickness. The hematoma is predominantly intermediate density and subacute in appearance. However, the hematoma does contain small volume acute hemorrhage. There is significant mass effect upon the underlying left cerebral hemisphere with partial effacement of the left lateral and third ventricles and 15 mm rightward midline shift. Entrapment of the right lateral ventricle with associated transependymal flow of CSF. Additionally, there is early rightward subfalcine herniation and early effacement of the left basal cisterns. Background chronic small-vessel ischemic changes within the cerebral white matter. Electronically Signed   By: Kellie Simmering D.O.   On: 03/21/2021 16:31   DG Chest Port 1 View  Result Date: 03/21/2021 CLINICAL DATA:  Questionable sepsis. EXAM: PORTABLE CHEST 1 VIEW COMPARISON:  05/22/2020 FINDINGS: 1151  hours. The cardio pericardial silhouette is enlarged. Interstitial markings are diffusely coarsened with chronic features. The lungs are clear without focal pneumonia, edema, pneumothorax or pleural effusion. Bones are diffusely demineralized. Telemetry leads overlie the chest. IMPRESSION: Low volume film without acute cardiopulmonary findings. Electronically Signed   By: Misty Stanley M.D.   On: 03/21/2021 12:08    Pending Labs Unresulted Labs (From admission, onward)     Start     Ordered   03/21/21 1529  Levetiracetam level  ONCE - STAT,   STAT        03/21/21 1528            Vitals/Pain Today's Vitals   03/22/21 0800 03/22/21 1015 03/22/21 1030 03/22/21 1215  BP:  (!) 156/107 (!) 155/95 (!) 147/99  Pulse: 77 62 (!) 110 (!) 103  Resp: 18 20 20 19   Temp:      TempSrc:      SpO2: 98% 99% 99% 99%  Weight:      PainSc:        Isolation Precautions No active isolations  Medications Medications  acetaminophen (TYLENOL) tablet 650 mg (has no administration in time range)    Or  acetaminophen (TYLENOL) suppository 650 mg (has no administration in time range)  levETIRAcetam (KEPPRA) 750 mg in sodium chloride 0.9 % 100 mL IVPB (0 mg Intravenous Stopped 03/22/21 1036)  haloperidol (HALDOL) tablet 0.5 mg (has no administration in time range)    Or  haloperidol (HALDOL) 2 MG/ML solution 0.5 mg (has no administration in time range)    Or  haloperidol lactate (HALDOL) injection 0.5 mg (has no administration in time range)  ondansetron (ZOFRAN-ODT)  disintegrating tablet 4 mg (has no administration in time range)    Or  ondansetron (ZOFRAN) injection 4 mg (has no administration in time range)  glycopyrrolate (ROBINUL) tablet 1 mg (has no administration in time range)    Or  glycopyrrolate (ROBINUL) injection 0.2 mg (has no administration in time range)    Or  glycopyrrolate (ROBINUL) injection 0.2 mg (has no administration in time range)  antiseptic oral rinse (BIOTENE) solution  15 mL (has no administration in time range)  polyvinyl alcohol (LIQUIFILM TEARS) 1.4 % ophthalmic solution 1 drop (has no administration in time range)  LORazepam (ATIVAN) tablet 1 mg (has no administration in time range)    Or  LORazepam (ATIVAN) 2 MG/ML concentrated solution 1 mg (has no administration in time range)    Or  LORazepam (ATIVAN) injection 1 mg (has no administration in time range)  morphine 2 MG/ML injection 1-2 mg (has no administration in time range)  lactated ringers bolus 1,000 mL (0 mLs Intravenous Stopped 03/21/21 1322)  ceFEPIme (MAXIPIME) 2 g in sodium chloride 0.9 % 100 mL IVPB (0 g Intravenous Stopped 03/21/21 1256)  metroNIDAZOLE (FLAGYL) IVPB 500 mg (0 mg Intravenous Stopped 03/21/21 1322)  vancomycin (VANCOREADY) IVPB 1500 mg/300 mL (0 mg Intravenous Stopped 03/21/21 1453)  diltiazem (CARDIZEM) 1 mg/mL load via infusion 10 mg (10 mg Intravenous Bolus from Bag 03/21/21 1304)    Mobility non-ambulatory High fall risk   Focused Assessments Neuro Assessment Handoff:  Swallow screen pass? No  Cardiac Rhythm: Atrial fibrillation       Neuro Assessment: Exceptions to WDL Neuro Checks:      Last Documented NIHSS Modified Score:   Has TPA been given? No If patient is a Neuro Trauma and patient is going to OR before floor call report to [place] nurse: [phone] or [phone]   R Recommendations: See Admitting Provider Note  Report given to:   Additional Notes: none

## 2021-03-22 NOTE — ED Notes (Signed)
Heidi Bean friend/POA [phone] requesting an update

## 2021-03-22 NOTE — Progress Notes (Signed)
This chaplain is present outside the Pt. Room. The chaplain introduced herself and updated  the Pt. RN-Jasmin.  The chaplain understands the Pt. family requested Last Rites for the Pt. The chaplain updated Father Ree Kida by VM of the change of the Pt. room.  This chaplain is available for F/U spiritual care as needed.  Chaplain Stephanie Acre [phone]

## 2021-03-22 NOTE — ED Notes (Signed)
Pt is remaining tachycardic after coming off the Cardizem drip. New EKG obtained. Dr. Jarvis Newcomer aware of pts heart rhythm. No pending orders to restart drip at this time.

## 2021-03-22 NOTE — ED Notes (Signed)
Cardizem drip stopped at this time per verbal order from Dr. Jarvis Newcomer

## 2021-03-22 NOTE — Progress Notes (Addendum)
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  Not candidate for NS intervention.  Paged by RN: pt with continued mentation decline.  I offered to re-scan patient head to look for worsening of SDH, RN called daughter who declined and requested pt be kept on comfort care.  This is more than reasonable.  Pt already showing herniation on last CT earlier this evening.  Herniation is usually a fatal event and likely the cause of her worsening mental status.  Pt with no obvious pain or distress at this point.  Will make pt comfort measures.

## 2021-03-23 DIAGNOSIS — G935 Compression of brain: Secondary | ICD-10-CM | POA: Diagnosis not present

## 2021-03-23 DIAGNOSIS — G40909 Epilepsy, unspecified, not intractable, without status epilepticus: Secondary | ICD-10-CM

## 2021-03-23 DIAGNOSIS — R4182 Altered mental status, unspecified: Secondary | ICD-10-CM | POA: Diagnosis not present

## 2021-03-23 DIAGNOSIS — I4891 Unspecified atrial fibrillation: Secondary | ICD-10-CM | POA: Diagnosis not present

## 2021-03-23 DIAGNOSIS — Z515 Encounter for palliative care: Secondary | ICD-10-CM | POA: Diagnosis not present

## 2021-03-23 LAB — LEVETIRACETAM LEVEL: Levetiracetam Lvl: 5 ug/mL — ABNORMAL LOW (ref 10.0–40.0)

## 2021-03-23 NOTE — Progress Notes (Signed)
[facility] PROGRESS NOTE  [name]  VHQ:[id number] DOB: 04/13/1930 DOA: 03/21/2021 PCP: Renford Dills, MD  Brief Narrative: Heidi Bean is an 87 y.o. female with a history of atrial fibrillation on aspirin, dementia, falls, recent covid-19 (Dx 12/8, received molnupiravir), and seizure disorder who presented to the ED 12/26 from SNF. She had been falling more frequently over the past couple weeks and presented with altered mental status. She was reportedly febrile to 103F and tachycardic with AFib w/RVR up to 180's, though afebrile here, tachycardic and hypertensive. WBC 11.9k, lactic acid 2.6. CXR with low volumes but no acute cardiopulmonary findings. Code sepsis was called. IV fluids and antibiotics were given, diltiazem infusion started, and patient admitted. CT of the head revealed a large left acute on chronic subdural hematoma with midline shift and early subfalcine herniation for which neurosurgery was consulted. After discussions, the family has opted not to pursue aggressive intervention. Palliative care has been involved in care and the patient will be admitted for comfort measures with prognosis estimated in hours-days.  Subjective:  Awake Says "oww" when I examine her--I cannot elieivt any other response from her No appearing in pain  Objective:  Awake incoherent large bump on L side head Cta b no rales no rhichi Abd soft nt no rebound no guard Neuro not tested S1 s 2no m CTAB anteriorly  Assessment & Plan: Admission for end of life care:  Family they are aware of the poor prognosis including worsening mental status overnight with suspected herniation. They wish for the patient to be admitted to the hospital, not to attempt residential hospice placement.  - Full comfort measures in place.  no appear in any distress at this time.  - DC medications not aimed at comfort for conditions listed below.  - Chaplain consulted -Convert IV Keppra to p.o. Keppra to prevent  seizures if possible -d/c to hospice house in 24-48 if continues to be stable  Large acute on chronic subdural hematoma: No neurosurgical treatment will be pursued based on family discussion with Dr. Yetta Barre, neurosurgery. Of course, hold anticoagulation/antiplatelet. Stable current - Continue keppra IV w/hx seizures and now SDH  Tested positive for covid-19 12/8, s/p antiviral Tx, no pulmonary findings. No longer suspect this is a symptomatic issue.  No longer requires isolation or treatment for this.  Rhetta Mura, MD [facility] [email or web address] 03/23/2021, 10:30 AM

## 2021-03-23 NOTE — Progress Notes (Signed)
Patient tested positive for COVID 2 days ago  (03/21/21) per labs and admission.

## 2021-03-23 NOTE — Progress Notes (Signed)
Daily Progress Note   Patient Name: Heidi Bean       Date: 03/23/2021 DOB: 1930/08/04  Age: 87 y.o. MRN#: [id number] Attending Physician: Rhetta Mura, MD Primary Care Physician: Renford Dills, MD Admit Date: 03/21/2021  Reason for Consultation/Follow-up: Disposition, Non pain symptom management, Pain control, Psychosocial/spiritual support, and Terminal Care  Subjective: Chart review performed. Received report from primary RN - no acute concerns. RN reports patient is lethargic, not eating/drinking.  Went to visit patient at bedside - no family/visitors present. Patient was lying in bed asleep - she does not wake to voice/gentle touch. No signs or non-verbal gestures of pain or discomfort noted. No respiratory distress, increased work of breathing, or secretions noted.   Called daughter/Heidi Bean who is with son/Heidi Bean. Provided family with updates from assessment today. Reviewed that at this time, patient is stable for transfer to residential hospice facility. Encouraged transfer as hospice home seems to meet their goal for patient to have more one on one care for end of life. Daughter is agreeable but son does not want transfer - he wishes for patient to remain in house. Education provided that hospice would provided the same level of care/comfort care patient is currently receiving in house - family expressed understanding.   Reviewed possibility for patient discharge to her LTC facility with hospice. Family were moving patient to [facility] in [place], [place]. Reviewed that Heidi Bean is 1.5 hours away from [facility] hospital per online directions. Family do not want patient transferred that far.   All questions and concerns addressed. Encouraged to call with questions and/or concerns. PMT  number provided.   Length of Stay: 2  Current Medications: Scheduled Meds:   antiseptic oral rinse  15 mL Topical BID    Continuous Infusions:  levETIRAcetam 750 mg (03/22/21 2238)    PRN Meds: acetaminophen **OR** acetaminophen, glycopyrrolate **OR** glycopyrrolate **OR** glycopyrrolate, haloperidol **OR** haloperidol **OR** haloperidol lactate, LORazepam **OR** LORazepam **OR** LORazepam, morphine injection, ondansetron **OR** ondansetron (ZOFRAN) IV, polyvinyl alcohol  Physical Exam Vitals and nursing note reviewed.  Constitutional:      General: She is not in acute distress. Pulmonary:     Effort: No respiratory distress.  Skin:    General: Skin is warm and dry.  Neurological:     Mental Status: She is unresponsive.  Psychiatric:  Speech: She is noncommunicative.            Vital Signs: BP (!) 150/84 (BP Location: Right Arm)    Pulse 90    Temp 97.8 F (36.6 C) (Oral)    Resp 20    Wt 74.1 kg Comment: On paperwork that came with patient   SpO2 97%    BMI 25.59 kg/m  SpO2: SpO2: 97 % O2 Device: O2 Device: Room Air O2 Flow Rate:    Intake/output summary:  Intake/Output Summary (Last 24 hours) at 03/23/2021 1048 Last data filed at 03/22/2021 1500 Gross per 24 hour  Intake 186.68 ml  Output --  Net 186.68 ml   LBM: Last BM Date: 03/22/21 Baseline Weight: Weight: 74.1 kg (On paperwork that came with patient) Most recent weight: Weight: 74.1 kg (On paperwork that came with patient)       Palliative Assessment/Data: PPS 10%    Flowsheet Rows    Flowsheet Row Most Recent Value  Intake Tab   Referral Department Hospitalist  Unit at Time of Referral ER  Palliative Care Primary Diagnosis Neurology  Date Notified 03/21/21  Palliative Care Type New Palliative care  Reason for referral End of Life Care Assistance, Clarify Goals of Care  Date of Admission 03/21/21  Date first seen by Palliative Care 03/22/21  # of days Palliative referral response time  1 Day(s)  # of days IP prior to Palliative referral 0  Clinical Assessment   Psychosocial & Spiritual Assessment   Palliative Care Outcomes   Patient/Family meeting held? Yes  Who was at the meeting? daughter, son  Palliative Care Outcomes Improved pain interventions, Improved non-pain symptom therapy, Clarified goals of care, Counseled regarding hospice, Provided end of life care assistance, Provided psychosocial or spiritual support, Changed to focus on comfort  Patient/Family wishes: Interventions discontinued/not started  Mechanical Ventilation, Antibiotics, Tube feedings/TPN, BiPAP, Hemodialysis, NIPPV, Trach, Transfusion, Vasopressors, PEG, Transfer out of ICU       Patient Active Problem List   Diagnosis Date Noted   Admission for end of life care 03/22/2021   DNR (do not resuscitate) 03/22/2021   Brain herniation ([place]) 03/22/2021   Hypokalemia 03/22/2021   Hypoalbuminemia due to protein-calorie malnutrition ([place]) 03/22/2021   Hyperbilirubinemia 03/22/2021   Microcytic anemia 03/22/2021   Lactic acidemia 03/22/2021   Demand ischemia of myocardium ([place]) 03/22/2021   Atrial fibrillation with RVR ([place]) 03/21/2021   COVID-19 virus infection 03/21/2021   Elevated troponin 03/21/2021   Hyponatremia 03/21/2021   Subdural hematoma 03/21/2021   AMS (altered mental status) 05/21/2020   Seizure disorder ([place]) 01/13/2019   Dementia ([place]) 09/08/2017   Essential hypertension 09/08/2017   Hypothyroidism 09/08/2017    Palliative Care Assessment & Plan   Patient Profile: 87 y.o. female  with past medical history of dementia, depression, HTN, PAF with no anticoagulation, and seizures presented to ED on 03/21/21 from [facility] with AMS. Patient was admitted on 03/21/2021 with AMS, COVID infection, atrial fibrillation with RVR, subdural hematoma secondary to fall, seizure disorder. Neurosurgery was consulted - discussed surgical options with family. Family opted not to  pursue surgical/aggressive intervention therefor comfort care was recommended. Prognosis for excellent functional recovery given current status is poor.     ED Course: vitals: afebrile, bp: 160/110, HR: 180, RR: 21, oxygen: 99% room air Pertinent labs: wbc: 11.9, hgb: 11.4, sodium: 133, total bili: 2.3, lactic acid: 2.6>pending, troponin: 42>pending, COVID positive (had covid 12/8)  CXR: no acute finding In ED: code sepsis activated.  Cultures obtained. Received 1L bolus and on maintenance fluids, given broad spectrum abx and started on cardizem drip. Covid came back positive which is likely driving afib/sepsis picture. TRH was asked to admit.   Assessment: Terminal care Subdural hematoma  Recommendations/Plan: Continue full comfort measures Continue DNR/DNI as previously documented Family are still not interested in patient transferring to residential hospice or LTC with hospice Continue keppra for seizure prophylaxis  Continue current comfort focused medication regimen - patient appears comfortable PMT will continue to follow and support holistically  Goals of Care and Additional Recommendations: Limitations on Scope of Treatment: Full Comfort Care  Code Status:    Code Status Orders  (From admission, onward)           Start     Ordered   03/22/21 1129  Do not attempt resuscitation (DNR)  Continuous       Question Answer Comment  In the event of cardiac or respiratory ARREST Do not call a code blue   In the event of cardiac or respiratory ARREST Do not perform Intubation, CPR, defibrillation or ACLS   In the event of cardiac or respiratory ARREST Use medication by any route, position, wound care, and other measures to relive pain and suffering. May use oxygen, suction and manual treatment of airway obstruction as needed for comfort.      03/22/21 1131           Code Status History     Date Active Date Inactive Code Status Order ID Comments User Context   03/21/2021  1801 03/22/2021 1131 DNR [id number]  Orma Flaming, MD ED   03/21/2021 1526 03/21/2021 1801 Partial Code [id number]  Orma Flaming, MD ED   03/21/2021 1514 03/21/2021 1526 Partial Code [id number]  Orma Flaming, MD ED   03/21/2021 1511 03/21/2021 1512 DNR [id number]  Orma Flaming, MD ED   05/21/2020 1819 05/24/2020 1706 Full Code [id number]  Lequita Halt, MD ED       Prognosis:  Hours - Days  Discharge Planning: Anticipated Hospital Death  Care plan was discussed with primary RN, patient's daughter and son, Dr. Alinda Dooms, [facility]  Thank you for allowing the Palliative Medicine Team to assist in the care of this patient.   Total Time 35 minutes Prolonged Time Billed  no       Greater than 50%  of this time was spent counseling and coordinating care related to the above assessment and plan.  Lin Landsman, NP  Please contact Palliative Medicine Team phone at [phone] for questions and concerns.

## 2021-03-24 ENCOUNTER — Inpatient Hospital Stay (HOSPITAL_COMMUNITY): Payer: Medicare Other

## 2021-03-24 DIAGNOSIS — Z515 Encounter for palliative care: Secondary | ICD-10-CM | POA: Diagnosis not present

## 2021-03-24 NOTE — Progress Notes (Signed)
12/29 Patient is at end of life, IM Letter respectfully mailed to address.

## 2021-03-24 NOTE — Care Management Important Message (Signed)
Important Message  Patient Details  Name: Heidi Bean MRN: [id number] Date of Birth: 01/14/1931   Medicare Important Message Given:  Yes     [name] 03/24/2021, 12:33 PM

## 2021-03-24 NOTE — TOC Progression Note (Signed)
Transition of Care [facility]) - Progression Note    Patient Details  Name: Sparkles Mcneely MRN: [id number] Date of Birth: 1930-06-05  Transition of Care [facility]) CM/SW Contact  Nadene Rubins Adria Devon, RN Phone Number: 03/24/2021, 11:56 AM  Clinical Narrative:     Consult for residential hospice.   Called patient's daughter Charlsie Merles [phone] . Discussed locations. She asked if her mother was stable to transfer to [facility]. NCM can discuss with MD. NCM explained once location is greater than 50 miles family would be asked to pay up front for ambulance transport .   Trula Ore is interested in [facility]. Trula Ore asking about cost. NCM asked Armanda Heritage with [facility] to call Trula Ore directly to discuss.   Expected Discharge Plan: Hospice Medical Facility    Expected Discharge Plan and Services Expected Discharge Plan: Hospice Medical Facility                                               Social Determinants of Health (SDOH) Interventions    Readmission Risk Interventions No flowsheet data found.

## 2021-03-24 NOTE — Progress Notes (Signed)
Civil engineer, contracting [facility]) Hospital Liaison Note  Received request for family interest in [facility]. Visited patient at bedside and spoke to confirm daughter/Christina/[phone] via phone call to confirm interest and explain services.  Approval for [facility] is determined by [facility] MD. Once [facility] MD has determined [facility] eligibility, [facility] will update hospital staff and family.  Please do not hesitate to call with any hospice related questions.    Thank you for the opportunity to participate in this patient's care.  Odette Fraction, MSW [facility] Liaison  [phone]

## 2021-03-24 NOTE — Care Management Important Message (Signed)
Important Message  Patient Details  Name: Heidi Bean MRN: [id number] Date of Birth: [birth year]   Medicare Important Message Given:  Other (see comment)     [name] 03/24/2021, 12:24 PM

## 2021-03-24 NOTE — Progress Notes (Signed)
[facility] PROGRESS NOTE  Heidi Bean  YQM:[id number] DOB: [birth year] DOA: 03/21/2021 PCP: Renford Dills, MD  Brief Narrative: Heidi Bean is an 87 y.o. female with a history of atrial fibrillation on aspirin, dementia, falls, recent covid-19 (Dx 12/8, received molnupiravir), and seizure disorder who presented to the ED 12/26 from SNF. She had been falling more frequently over the past couple weeks and presented with altered mental status. She was reportedly febrile to 103F and tachycardic with AFib w/RVR up to 180's, though afebrile here, tachycardic and hypertensive. WBC 11.9k, lactic acid 2.6. CXR with low volumes but no acute cardiopulmonary findings. Code sepsis was called. IV fluids and antibiotics were given, diltiazem infusion started, and patient admitted. CT of the head revealed a large left acute on chronic subdural hematoma with midline shift and early subfalcine herniation for which neurosurgery was consulted. After discussions, the family has opted not to pursue aggressive intervention. Palliative care has been involved in care and the patient will be admitted for comfort measures with prognosis estimated in hours-days.  Subjective:  Awakens-responds to me when I say good morning with good morning back but then is unintelligible  Objective:  Awake incoherent large bump on L side head Pupils unequal with right slightly more dilated to about 4 mm to 5 mm versus left side Cta b no rales no rhonchi Abd soft nt no rebound no guard Neuro not tested S1-S2 no murmur  Assessment & Plan: Admission for end of life care:  Family they are aware of the poor prognosis including worsening mental status overnight with suspected herniation.  - Full comfort measures in place.  no appear in any distress at this time.  - DC medications not aimed at comfort for conditions listed below.  - Chaplain consulted -Convert IV Keppra to p.o. Keppra to prevent seizures if possible -Discussed  with family on multiple days -We obtained a CT scan and this confirms 1.5 cm midline shift-they understand that this is irreversible and that precipitous decline could occur -[facility] is a plan for disposition-Case manager aware  Large acute on chronic subdural hematoma: No neurosurgical treatment will be pursued based on family discussion with Dr. Yetta Barre, neurosurgery. Of course, hold anticoagulation/antiplatelet. Stable current - Continue keppra IV w/hx seizures and now SDH  Tested positive for covid-19 12/8, s/p antiviral Tx, no pulmonary findings. No longer suspect this is a symptomatic issue.  No longer requires isolation or treatment for this.  Rhetta Mura, MD [facility] [email or web address] 03/24/2021, 11:28 AM

## 2021-03-25 DIAGNOSIS — G40909 Epilepsy, unspecified, not intractable, without status epilepticus: Secondary | ICD-10-CM | POA: Diagnosis not present

## 2021-03-25 DIAGNOSIS — S065XAA Traumatic subdural hemorrhage with loss of consciousness status unknown, initial encounter: Secondary | ICD-10-CM | POA: Diagnosis not present

## 2021-03-25 DIAGNOSIS — Z515 Encounter for palliative care: Secondary | ICD-10-CM | POA: Diagnosis not present

## 2021-03-25 MED ORDER — MORPHINE SULFATE (CONCENTRATE) 20 MG/ML PO SOLN: 5.0000 mg | ORAL | 0 refills | Status: DC | PRN

## 2021-03-25 MED ORDER — POLYVINYL ALCOHOL 1.4 % OP SOLN: 1.0000 [drp] | Freq: Four times a day (QID) | OPHTHALMIC | 0 refills | Status: DC | PRN

## 2021-03-25 MED ORDER — LORAZEPAM 2 MG/ML PO CONC: 1.0000 mg | ORAL | 0 refills | Status: DC | PRN

## 2021-03-25 MED ORDER — GLYCOPYRROLATE 0.2 MG/ML IJ SOLN: 0.2000 mg | INTRAMUSCULAR | 0 refills | Status: DC | PRN

## 2021-03-25 MED ORDER — ACETAMINOPHEN 325 MG PO TABS
650.0000 mg | ORAL_TABLET | Freq: Four times a day (QID) | ORAL | Status: DC | PRN
Start: 2021-03-25 — End: 2022-10-23

## 2021-03-25 MED ORDER — ONDANSETRON 4 MG PO TBDP
4.0000 mg | ORAL_TABLET | Freq: Four times a day (QID) | ORAL | 0 refills | Status: DC | PRN
Start: 2021-03-25 — End: 2022-10-23

## 2021-03-25 MED ORDER — HALOPERIDOL LACTATE 2 MG/ML PO CONC: 0.6000 mg | ORAL | 0 refills | Status: DC | PRN

## 2021-03-25 NOTE — Discharge Summary (Signed)
Discharge Summary  Heidi Bean UDJ:[id number] DOB: [birth year]  PCP: Renford Dills, MD  Admit date: 03/21/2021 Discharge date: 03/25/2021  Time spent: 25 minutes  Recommendations for Outpatient Follow-up:  Patient being discharged to [facility] facility for hospice Home medications being discontinued. Medications added only for pain, anxiety and seizure prevention  Discharge Diagnoses:  Active Hospital Problems   Diagnosis Date Noted   Admission for end of life care 03/22/2021   DNR (do not resuscitate) 03/22/2021   Brain herniation (HCC) 03/22/2021   Hypokalemia 03/22/2021   Hypoalbuminemia due to protein-calorie malnutrition (HCC) 03/22/2021   Hyperbilirubinemia 03/22/2021   Microcytic anemia 03/22/2021   Lactic acidemia 03/22/2021   Demand ischemia of myocardium (HCC) 03/22/2021   Atrial fibrillation with RVR (HCC) 03/21/2021   COVID-19 virus infection 03/21/2021   Elevated troponin 03/21/2021   Hyponatremia 03/21/2021   Subdural hematoma 03/21/2021   AMS (altered mental status) 05/21/2020   Seizure disorder (HCC) 01/13/2019   Dementia (HCC) 09/08/2017   Essential hypertension 09/08/2017   Hypothyroidism 09/08/2017    Resolved Hospital Problems  No resolved problems to display.    Discharge Condition: Terminal  Diet recommendation: Comfort feeds  Vitals:   03/24/21 1532 03/25/21 0421  BP: (!) 146/99 (!) 147/81  Pulse: 82 63  Resp: 20 14  Temp: 98.6 F (37 C) 98.5 F (36.9 C)  SpO2: 96% 96%    History of present illness:  Heidi Bean is an 87 y.o. female with a history of atrial fibrillation on aspirin, dementia, falls, recent covid-19 (Dx 12/8, received molnupiravir), and seizure disorder who presented to the ED 12/26 from SNF. She had been falling more frequently over the past couple weeks and presented with altered mental status. She was reportedly febrile to 103F and tachycardic with AFib w/RVR up to 180's, though afebrile here, tachycardic and  hypertensive. WBC 11.9k, lactic acid 2.6. CXR with low volumes but no acute cardiopulmonary findings. Code sepsis was called. IV fluids and antibiotics were given, diltiazem infusion started, and patient admitted. CT of the head revealed a large left acute on chronic subdural hematoma with midline shift and early subfalcine herniation for which neurosurgery was consulted. After discussions, the family has opted not to pursue aggressive intervention. Palliative care has been involved in care and the patient will be admitted for comfort measures with prognosis estimated in hours-days.  Hospital Course:  Principal Problem:   Admission for end of life care Active Problems:   AMS (altered mental status)   Dementia (HCC)   Essential hypertension   Hypothyroidism: Synthroid discontinued now that she is comfort care.    Seizure disorder (HCC): Continue Keppra.    Atrial fibrillation with RVR (HCC): Rate controlled medicines stopped.    Elevated troponin   Hyponatremia   Subdural hematoma: Large acute on chronic.  No neurosurgical treatment will be pursued based on family discussion with neurosurgery.  Have held all anticoagulation and antiplatelet medications.  Continue Keppra.   DNR (do not resuscitate)   Brain herniation (HCC)   Hypokalemia   Hypoalbuminemia due to protein-calorie malnutrition [facility]): Now comfort care.     Hyperbilirubinemia   Microcytic anemia   Lactic acidemia   Demand ischemia of myocardium (HCC)   Procedures: None  Consultations: Neurosurgery  Discharge Exam: BP (!) 147/81 (BP Location: Left Arm)    Pulse 63    Temp 98.5 F (36.9 C) (Oral)    Resp 14    Wt 74.1 kg Comment: On paperwork that came with patient  SpO2 96%    BMI 25.59 kg/m   General: Awake Cardiovascular: Regular rate and rhythm, S1-S2 Respiratory: Clear auscultation bilaterally  Discharge Instructions You were cared for by a hospitalist during your hospital stay. If you have any questions  about your discharge medications or the care you received while you were in the hospital after you are discharged, you can call the unit and asked to speak with the hospitalist on call if the hospitalist that took care of you is not available. Once you are discharged, your primary care physician will handle any further medical issues. Please note that NO REFILLS for any discharge medications will be authorized once you are discharged, as it is imperative that you return to your primary care physician (or establish a relationship with a primary care physician if you do not have one) for your aftercare needs so that they can reassess your need for medications and monitor your lab values.  Discharge Instructions         Diet - low sodium heart healthy   Complete by: As directed       Allergies as of 03/25/2021   No Known Allergies      Medication List     STOP taking these medications    aspirin 81 MG EC tablet   calcium-vitamin D 500-200 MG-UNIT Tabs tablet Commonly known as: OSCAL WITH D   donepezil 10 MG tablet Commonly known as: ARICEPT   escitalopram 10 MG tablet Commonly known as: LEXAPRO   gabapentin 300 MG capsule Commonly known as: NEURONTIN   hydrochlorothiazide 12.5 MG tablet Commonly known as: HYDRODIURIL   levothyroxine 25 MCG tablet Commonly known as: SYNTHROID   losartan 50 MG tablet Commonly known as: COZAAR   melatonin 5 MG Tabs   metoprolol tartrate 25 MG tablet Commonly known as: LOPRESSOR   nystatin 100000 UNIT/ML suspension Commonly known as: MYCOSTATIN   nystatin cream Commonly known as: MYCOSTATIN   PreserVision AREDS 2+Multi Vit Caps   sodium chloride 1 g tablet   sodium fluoride 1.1 % Crea dental cream Commonly known as: PREVIDENT 5000 PLUS   Vitamin D 50 MCG (2000 UT) tablet       TAKE these medications    acetaminophen 325 MG tablet Commonly known as: TYLENOL Take 2 tablets (650 mg total) by mouth every 6 (six) hours as  needed for mild pain (or Fever >/= 101). What changed:  when to take this reasons to take this   glycopyrrolate 0.2 MG/ML injection Commonly known as: ROBINUL Inject 1 mL (0.2 mg total) into the skin every 4 (four) hours as needed (excessive secretions).   haloperidol 2 MG/ML solution Commonly known as: HALDOL Place 0.3 mLs (0.6 mg total) under the tongue every 4 (four) hours as needed for agitation (or delirium).   levETIRAcetam 750 MG tablet Commonly known as: KEPPRA Take 750 mg by mouth 2 (two) times daily.   LORazepam 2 MG/ML concentrated solution Commonly known as: ATIVAN Place 0.5 mLs (1 mg total) under the tongue every hour as needed for anxiety or seizure (distress).   morphine 20 MG/ML concentrated solution Commonly known as: ROXANOL Take 0.25 mLs (5 mg total) by mouth every 4 (four) hours as needed for severe pain.   ondansetron 4 MG disintegrating tablet Commonly known as: ZOFRAN-ODT Take 1 tablet (4 mg total) by mouth every 6 (six) hours as needed for nausea.   polyvinyl alcohol 1.4 % ophthalmic solution Commonly known as: LIQUIFILM TEARS Place 1 drop into both eyes  4 (four) times daily as needed for dry eyes.       No Known Allergies    The results of significant diagnostics from this hospitalization (including imaging, microbiology, ancillary and laboratory) are listed below for reference.    Significant Diagnostic Studies: DG Pelvis 1-2 Views  Result Date: 03/02/2021 CLINICAL DATA:  Left hip pain after fall. EXAM: PELVIS - 1-2 VIEW COMPARISON:  Pelvis x-ray 05/18/2020. FINDINGS: There is no evidence of pelvic fracture or diastasis. No pelvic bone lesions are seen. There are mild degenerative changes of both hips. Degenerative changes also affect the visualized lower lumbar spine. There are vascular calcifications in the soft tissues. IMPRESSION: No acute fracture or dislocation. Electronically Signed   By: Darliss Cheney M.D.   On: 03/02/2021 20:56   CT  HEAD WO CONTRAST ( )  Result Date: 03/21/2021 CLINICAL DATA:  Provided history: Altered mental status, nontraumatic. EXAM: CT HEAD WITHOUT CONTRAST TECHNIQUE: Contiguous axial images were obtained from the base of the skull through the vertex without intravenous contrast. COMPARISON:  Prior head CT examinations 03/06/2021 and earlier. Brain MRI 05/22/2020. FINDINGS: Brain: There is a large mixed density subdural hematoma overlying the left cerebral hemisphere. The hematoma is predominantly intermediate in density, but does contain small volume acute hyperdense blood products. The hematoma measures up to 2 cm in thickness. There is significant mass effect on the underlying left cerebral hemisphere with partial effacement of the left lateral and third ventricles and 15 mm rightward midline shift measured at the level of the septum pellucidum. There is asymmetric prominence of the right lateral ventricle compatible with ventricular entrapment. There is periventricular hypodensity likely reflecting transependymal flow of CSF. Additionally, there is suspected early rightward subfalcine herniation and partial effacement of the left basal cisterns. Background patchy and ill-defined hypoattenuation within the cerebral white matter, nonspecific but compatible with chronic small vessel ischemic disease. No definite acute demarcated cortical infarct is identified. No evidence of an intracranial mass. Vascular: Hyperdense vessel.  Atherosclerotic calcifications. Skull: Normal. Negative for fracture or focal lesion. Sinuses/Orbits: Visualized orbits show no acute finding. Trace mucosal thickening within the right maxillary sinus. Small fluid level within the right sphenoid sinus. Small mucous retention cyst within the left frontoethmoidal recess. These results were called by telephone at the time of interpretation on 03/21/2021 at 4:28 pm to provider [facility] , who verbally acknowledged these results. IMPRESSION: Large  mixed density subdural hematoma overlying left cerebral hemisphere, measuring up to 2 cm in thickness. The hematoma is predominantly intermediate density and subacute in appearance. However, the hematoma does contain small volume acute hemorrhage. There is significant mass effect upon the underlying left cerebral hemisphere with partial effacement of the left lateral and third ventricles and 15 mm rightward midline shift. Entrapment of the right lateral ventricle with associated transependymal flow of CSF. Additionally, there is early rightward subfalcine herniation and early effacement of the left basal cisterns. Background chronic small-vessel ischemic changes within the cerebral white matter. Electronically Signed   By: Jackey Loge D.O.   On: 03/21/2021 16:31   CT Head Wo Contrast  Result Date: 03/06/2021 CLINICAL DATA:  Facial trauma, blunt. Unwitnessed fall. Left forehead hematoma and laceration. EXAM: CT HEAD WITHOUT CONTRAST CT MAXILLOFACIAL WITHOUT CONTRAST CT CERVICAL SPINE WITHOUT CONTRAST TECHNIQUE: Multidetector CT imaging of the head, cervical spine, and maxillofacial structures were performed using the standard protocol without intravenous contrast. Multiplanar CT image reconstructions of the cervical spine and maxillofacial structures were also generated. COMPARISON:  03/02/2021. FINDINGS: CT  HEAD FINDINGS Brain: No acute intracranial hemorrhage, midline shift or mass effect. Diffuse cortical atrophy is noted. No extra-axial fluid collection is identified. Periventricular white matter hypodensities are noted bilaterally. There is no hydrocephalus. Vascular: No hyperdense vessel or unexpected calcification. Skull: Normal. Negative for fracture or focal lesion. Other: Scalp hematoma containing air is present over the frontal bone on the left. CT MAXILLOFACIAL FINDINGS Osseous: No fracture or mandibular dislocation. No destructive process. Orbits: No traumatic or inflammatory finding. Sinuses: Mild  mucosal thickening is present in the right maxillary sinus in ethmoid air cells bilaterally. No air-fluid levels are seen. Soft tissues: A scalp hematoma containing air is present over the frontal bone on the left. CT CERVICAL SPINE FINDINGS Alignment: There is anterolisthesis at C2-C3, C3-C4 and C7-T1. No evidence of perched facet. Skull base and vertebrae: No acute fracture. No primary bone lesion or focal pathologic process. Soft tissues and spinal canal: No prevertebral fluid or swelling. No visible canal hematoma. Disc levels: Multilevel intervertebral disc space narrowing, uncovertebral osteophyte formation and facet arthropathy are noted resulting in mild spinal canal and mild-to-moderate neural foraminal stenosis. There is severe neural foraminal stenosis at C3-C4 on the left. Upper chest: Mild apical pleural scarring is noted on the left. Other: Calcifications at the carotid bulbs bilaterally. Hypodense lesion in the left lobe of the thyroid gland is unchanged. IMPRESSION: 1. No acute intracranial hemorrhage. 2. Atrophy with chronic microvascular ischemic changes. 3. Scalp hematoma containing air over the frontal bone on the left. 4. No acute facial bone fracture. 5. Stable multilevel degenerative changes in the cervical spine without evidence of acute fracture. 6. Stable hypodense region in the left lobe of the thyroid gland. Electronically Signed   By: Thornell Sartorius M.D.   On: 03/06/2021 21:39   CT Head Wo Contrast  Result Date: 03/02/2021 CLINICAL DATA:  Trauma. EXAM: CT HEAD WITHOUT CONTRAST CT CERVICAL SPINE WITHOUT CONTRAST TECHNIQUE: Multidetector CT imaging of the head and cervical spine was performed following the standard protocol without intravenous contrast. Multiplanar CT image reconstructions of the cervical spine were also generated. COMPARISON:  CT dated 02/13/2021. FINDINGS: CT HEAD FINDINGS Brain: Mild age-related atrophy and chronic microvascular ischemic changes. There is no acute  intracranial hemorrhage. No mass effect or midline shift. No extra-axial fluid collection. Vascular: No hyperdense vessel or unexpected calcification. Skull: Normal. Negative for fracture or focal lesion. Sinuses/Orbits: Mild mucoperiosteal thickening of paranasal sinuses. No air-fluid level. Mastoid air cells are clear. Other: None CT CERVICAL SPINE FINDINGS Alignment: No acute subluxation. Skull base and vertebrae: No acute fracture.  Osteopenia. Soft tissues and spinal canal: No prevertebral fluid or swelling. No visible canal hematoma. Disc levels: Multilevel degenerative changes with disc space narrowing and endplate irregularity. Multilevel facet arthropathy. Grade 1 C3-C4 anterolisthesis. Upper chest: Negative. Other: Bilateral carotid bulb calcified plaques. A 2.8 x 1.8 cm soft tissue structure inferior to the right mandible (50/4) appears similar to prior CTs and likely represents the right submandibular gland. Correlation with clinical exam is recommended to exclude an enlarged lymph node. The left submandibular gland is not visualized. IMPRESSION: 1. No acute intracranial pathology. Mild age-related atrophy and chronic microvascular ischemic changes. 2. No acute/traumatic cervical spine pathology. Multilevel degenerative changes. Electronically Signed   By: Elgie Collard M.D.   On: 03/02/2021 20:39   CT Cervical Spine Wo Contrast  Result Date: 03/06/2021 CLINICAL DATA:  Facial trauma, blunt. Unwitnessed fall. Left forehead hematoma and laceration. EXAM: CT HEAD WITHOUT CONTRAST CT MAXILLOFACIAL WITHOUT CONTRAST CT CERVICAL  SPINE WITHOUT CONTRAST TECHNIQUE: Multidetector CT imaging of the head, cervical spine, and maxillofacial structures were performed using the standard protocol without intravenous contrast. Multiplanar CT image reconstructions of the cervical spine and maxillofacial structures were also generated. COMPARISON:  03/02/2021. FINDINGS: CT HEAD FINDINGS Brain: No acute intracranial  hemorrhage, midline shift or mass effect. Diffuse cortical atrophy is noted. No extra-axial fluid collection is identified. Periventricular white matter hypodensities are noted bilaterally. There is no hydrocephalus. Vascular: No hyperdense vessel or unexpected calcification. Skull: Normal. Negative for fracture or focal lesion. Other: Scalp hematoma containing air is present over the frontal bone on the left. CT MAXILLOFACIAL FINDINGS Osseous: No fracture or mandibular dislocation. No destructive process. Orbits: No traumatic or inflammatory finding. Sinuses: Mild mucosal thickening is present in the right maxillary sinus in ethmoid air cells bilaterally. No air-fluid levels are seen. Soft tissues: A scalp hematoma containing air is present over the frontal bone on the left. CT CERVICAL SPINE FINDINGS Alignment: There is anterolisthesis at C2-C3, C3-C4 and C7-T1. No evidence of perched facet. Skull base and vertebrae: No acute fracture. No primary bone lesion or focal pathologic process. Soft tissues and spinal canal: No prevertebral fluid or swelling. No visible canal hematoma. Disc levels: Multilevel intervertebral disc space narrowing, uncovertebral osteophyte formation and facet arthropathy are noted resulting in mild spinal canal and mild-to-moderate neural foraminal stenosis. There is severe neural foraminal stenosis at C3-C4 on the left. Upper chest: Mild apical pleural scarring is noted on the left. Other: Calcifications at the carotid bulbs bilaterally. Hypodense lesion in the left lobe of the thyroid gland is unchanged. IMPRESSION: 1. No acute intracranial hemorrhage. 2. Atrophy with chronic microvascular ischemic changes. 3. Scalp hematoma containing air over the frontal bone on the left. 4. No acute facial bone fracture. 5. Stable multilevel degenerative changes in the cervical spine without evidence of acute fracture. 6. Stable hypodense region in the left lobe of the thyroid gland. Electronically  Signed   By: Thornell Sartorius M.D.   On: 03/06/2021 21:39   CT Cervical Spine Wo Contrast  Result Date: 03/02/2021 CLINICAL DATA:  Trauma. EXAM: CT HEAD WITHOUT CONTRAST CT CERVICAL SPINE WITHOUT CONTRAST TECHNIQUE: Multidetector CT imaging of the head and cervical spine was performed following the standard protocol without intravenous contrast. Multiplanar CT image reconstructions of the cervical spine were also generated. COMPARISON:  CT dated 02/13/2021. FINDINGS: CT HEAD FINDINGS Brain: Mild age-related atrophy and chronic microvascular ischemic changes. There is no acute intracranial hemorrhage. No mass effect or midline shift. No extra-axial fluid collection. Vascular: No hyperdense vessel or unexpected calcification. Skull: Normal. Negative for fracture or focal lesion. Sinuses/Orbits: Mild mucoperiosteal thickening of paranasal sinuses. No air-fluid level. Mastoid air cells are clear. Other: None CT CERVICAL SPINE FINDINGS Alignment: No acute subluxation. Skull base and vertebrae: No acute fracture.  Osteopenia. Soft tissues and spinal canal: No prevertebral fluid or swelling. No visible canal hematoma. Disc levels: Multilevel degenerative changes with disc space narrowing and endplate irregularity. Multilevel facet arthropathy. Grade 1 C3-C4 anterolisthesis. Upper chest: Negative. Other: Bilateral carotid bulb calcified plaques. A 2.8 x 1.8 cm soft tissue structure inferior to the right mandible (50/4) appears similar to prior CTs and likely represents the right submandibular gland. Correlation with clinical exam is recommended to exclude an enlarged lymph node. The left submandibular gland is not visualized. IMPRESSION: 1. No acute intracranial pathology. Mild age-related atrophy and chronic microvascular ischemic changes. 2. No acute/traumatic cervical spine pathology. Multilevel degenerative changes. Electronically Signed   By:  Elgie Collard M.D.   On: 03/02/2021 20:39   DG Chest Port 1  View  Result Date: 03/21/2021 CLINICAL DATA:  Questionable sepsis. EXAM: PORTABLE CHEST 1 VIEW COMPARISON:  05/22/2020 FINDINGS: 1151 hours. The cardio pericardial silhouette is enlarged. Interstitial markings are diffusely coarsened with chronic features. The lungs are clear without focal pneumonia, edema, pneumothorax or pleural effusion. Bones are diffusely demineralized. Telemetry leads overlie the chest. IMPRESSION: Low volume film without acute cardiopulmonary findings. Electronically Signed   By: Kennith Center M.D.   On: 03/21/2021 12:08   CT HEAD WO CONTRAST (CHARM STUDY)  Result Date: 03/24/2021 CLINICAL DATA:  Stroke follow-up EXAM: CT HEAD WITHOUT CONTRAST TECHNIQUE: Contiguous axial images were obtained from the base of the skull through the vertex without intravenous contrast. COMPARISON:  Three days ago FINDINGS: Brain: Subdural hematoma along the lateral left cerebral convexity measuring up to 14 mm in thickness. The hematoma is peripherally iso to low-dense and centrally high-dense. Unchanged midline shift of 14 mm with asymmetric dilatation of the right lateral ventricle when compared to more remote studies. No complicating infarct. Stable white matter low-density Vascular: No hyperdense vessel or unexpected calcification. Skull: Normal. Negative for fracture or focal lesion. Sinuses/Orbits: Bilateral cataract resection IMPRESSION: Unchanged mixed density subdural hematoma along the right cerebral convexity that causes 14 mm of midline shift and right lateral ventricular entrapment. Electronically Signed   By: Tiburcio Pea M.D.   On: 03/24/2021 04:10   CT Maxillofacial WO CM  Result Date: 03/06/2021 CLINICAL DATA:  Facial trauma, blunt. Unwitnessed fall. Left forehead hematoma and laceration. EXAM: CT HEAD WITHOUT CONTRAST CT MAXILLOFACIAL WITHOUT CONTRAST CT CERVICAL SPINE WITHOUT CONTRAST TECHNIQUE: Multidetector CT imaging of the head, cervical spine, and maxillofacial structures  were performed using the standard protocol without intravenous contrast. Multiplanar CT image reconstructions of the cervical spine and maxillofacial structures were also generated. COMPARISON:  03/02/2021. FINDINGS: CT HEAD FINDINGS Brain: No acute intracranial hemorrhage, midline shift or mass effect. Diffuse cortical atrophy is noted. No extra-axial fluid collection is identified. Periventricular white matter hypodensities are noted bilaterally. There is no hydrocephalus. Vascular: No hyperdense vessel or unexpected calcification. Skull: Normal. Negative for fracture or focal lesion. Other: Scalp hematoma containing air is present over the frontal bone on the left. CT MAXILLOFACIAL FINDINGS Osseous: No fracture or mandibular dislocation. No destructive process. Orbits: No traumatic or inflammatory finding. Sinuses: Mild mucosal thickening is present in the right maxillary sinus in ethmoid air cells bilaterally. No air-fluid levels are seen. Soft tissues: A scalp hematoma containing air is present over the frontal bone on the left. CT CERVICAL SPINE FINDINGS Alignment: There is anterolisthesis at C2-C3, C3-C4 and C7-T1. No evidence of perched facet. Skull base and vertebrae: No acute fracture. No primary bone lesion or focal pathologic process. Soft tissues and spinal canal: No prevertebral fluid or swelling. No visible canal hematoma. Disc levels: Multilevel intervertebral disc space narrowing, uncovertebral osteophyte formation and facet arthropathy are noted resulting in mild spinal canal and mild-to-moderate neural foraminal stenosis. There is severe neural foraminal stenosis at C3-C4 on the left. Upper chest: Mild apical pleural scarring is noted on the left. Other: Calcifications at the carotid bulbs bilaterally. Hypodense lesion in the left lobe of the thyroid gland is unchanged. IMPRESSION: 1. No acute intracranial hemorrhage. 2. Atrophy with chronic microvascular ischemic changes. 3. Scalp hematoma  containing air over the frontal bone on the left. 4. No acute facial bone fracture. 5. Stable multilevel degenerative changes in the cervical spine without  evidence of acute fracture. 6. Stable hypodense region in the left lobe of the thyroid gland. Electronically Signed   By: Thornell Sartorius M.D.   On: 03/06/2021 21:39    Microbiology: Recent Results (from the past 240 hour(s))  Resp Panel by RT-PCR (Flu A&B, Covid)     Status: Abnormal   Collection Time: 03/21/21 11:47 AM   Specimen: Nasopharyngeal(NP) swabs in vial transport medium  Result Value Ref Range Status   SARS Coronavirus 2 by RT PCR POSITIVE (A) NEGATIVE Final    Comment: (NOTE) SARS-CoV-2 target nucleic acids are DETECTED.  The SARS-CoV-2 RNA is generally detectable in upper respiratory specimens during the acute phase of infection. Positive results are indicative of the presence of the identified virus, but do not rule out bacterial infection or co-infection with other pathogens not detected by the test. Clinical correlation with patient history and other diagnostic information is necessary to determine patient infection status. The expected result is Negative.  Fact Sheet for Patients: BloggerCourse.com  Fact Sheet for Healthcare Providers: SeriousBroker.it  This test is not yet approved or cleared by the [place] FDA and  has been authorized for detection and/or diagnosis of SARS-CoV-2 by FDA under an Emergency Use Authorization (EUA).  This EUA will remain in effect (meaning this test can be used) for the duration of  the COVID-19 declaration under Section 564(b)(1) of the A ct, 21 U.S.C. section 360bbb-3(b)(1), unless the authorization is terminated or revoked sooner.     Influenza A by PCR NEGATIVE NEGATIVE Final   Influenza B by PCR NEGATIVE NEGATIVE Final    Comment: (NOTE) The Xpert Xpress SARS-CoV-2/FLU/RSV plus assay is intended as an aid in the  diagnosis of influenza from Nasopharyngeal swab specimens and should not be used as a sole basis for treatment. Nasal washings and aspirates are unacceptable for Xpert Xpress SARS-CoV-2/FLU/RSV testing.  Fact Sheet for Patients: BloggerCourse.com  Fact Sheet for Healthcare Providers: SeriousBroker.it  This test is not yet approved or cleared by the [place] FDA and has been authorized for detection and/or diagnosis of SARS-CoV-2 by FDA under an Emergency Use Authorization (EUA). This EUA will remain in effect (meaning this test can be used) for the duration of the COVID-19 declaration under Section 564(b)(1) of the Act, 21 U.S.C. section 360bbb-3(b)(1), unless the authorization is terminated or revoked.  Performed at [facility], [address] [place], [place] [address]   Urine Culture     Status: Abnormal   Collection Time: 03/21/21 11:47 AM   Specimen: In/Out Cath Urine  Result Value Ref Range Status   Specimen Description IN/OUT CATH URINE  Final   Special Requests NONE  Final   Culture (A)  Final    1,000 COLONIES/mL GROUP B STREP(S.AGALACTIAE)ISOLATED TESTING AGAINST S. AGALACTIAE NOT ROUTINELY PERFORMED DUE TO PREDICTABILITY OF AMP/PEN/VAN SUSCEPTIBILITY. Performed at [facility], [address], [place], [place] [address]    Report Status 03/22/2021 FINAL  Final  Blood Culture (routine x 2)     Status: None (Preliminary result)   Collection Time: 03/21/21 12:04 PM   Specimen: BLOOD  Result Value Ref Range Status   Specimen Description BLOOD RIGHT ANTECUBITAL  Final   Special Requests   Final    BOTTLES DRAWN AEROBIC AND ANAEROBIC Blood Culture results may not be optimal due to an inadequate volume of blood received in culture bottles   Culture   Final    NO GROWTH 3 DAYS Performed at [facility], [address], [place], [place] [address]    Report Status PENDING  Incomplete  Blood  Culture (routine x 2)     Status: None (Preliminary result)   Collection Time: 03/21/21 12:11 PM   Specimen: BLOOD RIGHT HAND  Result Value Ref Range Status   Specimen Description BLOOD RIGHT HAND  Final   Special Requests   Final    BOTTLES DRAWN AEROBIC AND ANAEROBIC Blood Culture results may not be optimal due to an inadequate volume of blood received in culture bottles   Culture   Final    NO GROWTH 3 DAYS Performed at [facility], [address] [place], [place] [address]    Report Status PENDING  Incomplete     Labs: Basic Metabolic Panel: Recent Labs  Lab 03/21/21 1204 03/22/21 0459  NA 133* 131*  K 4.0 3.2*  CL 99 100  CO2 22 22  GLUCOSE 118* 117*  BUN 12 11  CREATININE 0.85 0.72  CALCIUM 8.7* 8.1*  MG 1.7  --    Liver Function Tests: Recent Labs  Lab 03/21/21 1204 03/22/21 0459  AST 21 19  ALT 11 10  ALKPHOS 101 85  BILITOT 2.3* 2.1*  PROT 7.1 6.1*  ALBUMIN 3.3* 2.8*   No results for input(s): LIPASE, AMYLASE in the last 168 hours. Recent Labs  Lab 03/21/21 1622  AMMONIA 30   CBC: Recent Labs  Lab 03/21/21 1204 03/22/21 0459  WBC 11.9* 11.6*  NEUTROABS 10.0*  --   HGB 11.4* 10.0*  HCT 37.1 31.4*  MCV 77.5* 75.7*  PLT 323 286   Cardiac Enzymes: No results for input(s): CKTOTAL, CKMB, CKMBINDEX, TROPONINI in the last 168 hours. BNP: BNP (last 3 results) No results for input(s): BNP in the last 8760 hours.  ProBNP (last 3 results) No results for input(s): PROBNP in the last 8760 hours.  CBG: Recent Labs  Lab 03/21/21 1155 03/21/21 1322  GLUCAP 117* 137*       Signed:  Hollice Espy, MD [facility] 03/25/2021, 9:19 AM

## 2021-03-25 NOTE — Care Management (Signed)
[facility] has offered patient a bed. Once consents are signed Heidi Bean with [facility] will let NCM know to call [facility].   [facility] paperwork in chart.   DNR form left at [facility] nurses station and MD will sign.   Nurse to call report to [facility] at [phone]

## 2021-03-25 NOTE — Progress Notes (Signed)
[facility] Hospital Liaison Note  Consents to be signed by family   Please send signed DNR form with patient and RN call report to [phone].    Odette Fraction, MSW [facility] Liaison [phone]

## 2021-03-25 NOTE — Progress Notes (Signed)
Daily Progress Note   Patient Name: Heidi Bean       Date: 03/25/2021 DOB: Jul 31, 1930  Age: 87 y.o. MRN#: [id number] Attending Physician: Hollice Espy, MD Primary Care Physician: Renford Dills, MD Admit Date: 03/21/2021  Reason for Consultation/Follow-up: symptom management, end of life care  Subjective: Patient appears comfortable. Unresponsive to voice but does awake to light touch. No non-verbal signs of pain or discomfort noted. Respirations are even and unlabored. No excessive respiratory secretions noted. No family present at bedside.     Length of Stay: 4   Current Medications: Scheduled Meds:   antiseptic oral rinse  15 mL Topical BID    Continuous Infusions:  levETIRAcetam 750 mg (03/25/21 0806)    PRN Meds: acetaminophen **OR** acetaminophen, glycopyrrolate **OR** glycopyrrolate **OR** glycopyrrolate, haloperidol **OR** haloperidol **OR** haloperidol lactate, LORazepam **OR** LORazepam **OR** LORazepam, morphine injection, ondansetron **OR** ondansetron (ZOFRAN) IV, polyvinyl alcohol     Vital Signs: BP (!) 147/81 (BP Location: Left Arm)    Pulse 63    Temp 98.5 F (36.9 C) (Oral)    Resp 14    Wt 74.1 kg Comment: On paperwork that came with patient   SpO2 96%    BMI 25.59 kg/m  SpO2: SpO2: 96 % O2 Device: O2 Device: Room Air O2 Flow Rate:         Palliative Assessment/Data: PPS 20%     Palliative Care Assessment & Plan   Patient Profile: 87 y.o. female  with past medical history of dementia, depression, HTN, PAF with no anticoagulation, and seizures presented to ED on 03/21/21 from [facility] with AMS. Patient was admitted on 03/21/2021 with AMS, COVID infection, atrial fibrillation with RVR, subdural hematoma secondary to fall, seizure disorder.  Neurosurgery was consulted - discussed surgical options with family. Family opted not to pursue surgical/aggressive intervention therefor comfort care was recommended. Prognosis for excellent functional recovery given current status is poor.     ED Course: vitals: afebrile, bp: 160/110, HR: 180, RR: 21, oxygen: 99% room air Pertinent labs: wbc: 11.9, hgb: 11.4, sodium: 133, total bili: 2.3, lactic acid: 2.6>pending, troponin: 42>pending, COVID positive (had covid 12/8)  CXR: no acute finding In ED: code sepsis activated. Cultures obtained. Received 1L bolus and on maintenance fluids, given broad spectrum abx and started on cardizem drip. Covid came back  positive which is likely driving afib/sepsis picture. [facility] was asked to admit.    Assessment: Terminal care Subdural hematoma   Recommendations/Plan: Continue full comfort measures Continue DNR/DNI as previously documented Continue current comfort focused medication regimen - patient appears comfortable Pending discharge to [facility] this evening Continue keppra for seizure prophylaxis until discharge    Goals of Care and Additional Recommendations: Limitations on Scope of Treatment: Full Comfort Care  Code Status: DNR/DNI  Prognosis:  < 2 weeks  Discharge Planning: Hospice facility    Thank you for allowing the Palliative Medicine Team to assist in the care of this patient.   Total Time 15 minutes Prolonged Time Billed  no       Greater than 50%  of this time was spent counseling and coordinating care related to the above assessment and plan.  Lavena Bullion, NP  Please contact Palliative Medicine Team phone at [phone] for questions and concerns.

## 2021-03-25 NOTE — Progress Notes (Signed)
Report given to [facility], no further question asked.

## 2021-03-25 NOTE — Plan of Care (Signed)

## 2021-03-26 LAB — CULTURE, BLOOD (ROUTINE X 2)
Culture: NO GROWTH
Culture: NO GROWTH

## 2021-04-27 DEATH — deceased

## 2021-08-16 IMAGING — CR DG CHEST 2V
2 series · 2 of 2 positions shown · non-contrast
Comparison: 04/19/2020

CLINICAL DATA: Unwitnessed fall

EXAM:
CHEST - 2 VIEW

[x chest ap]
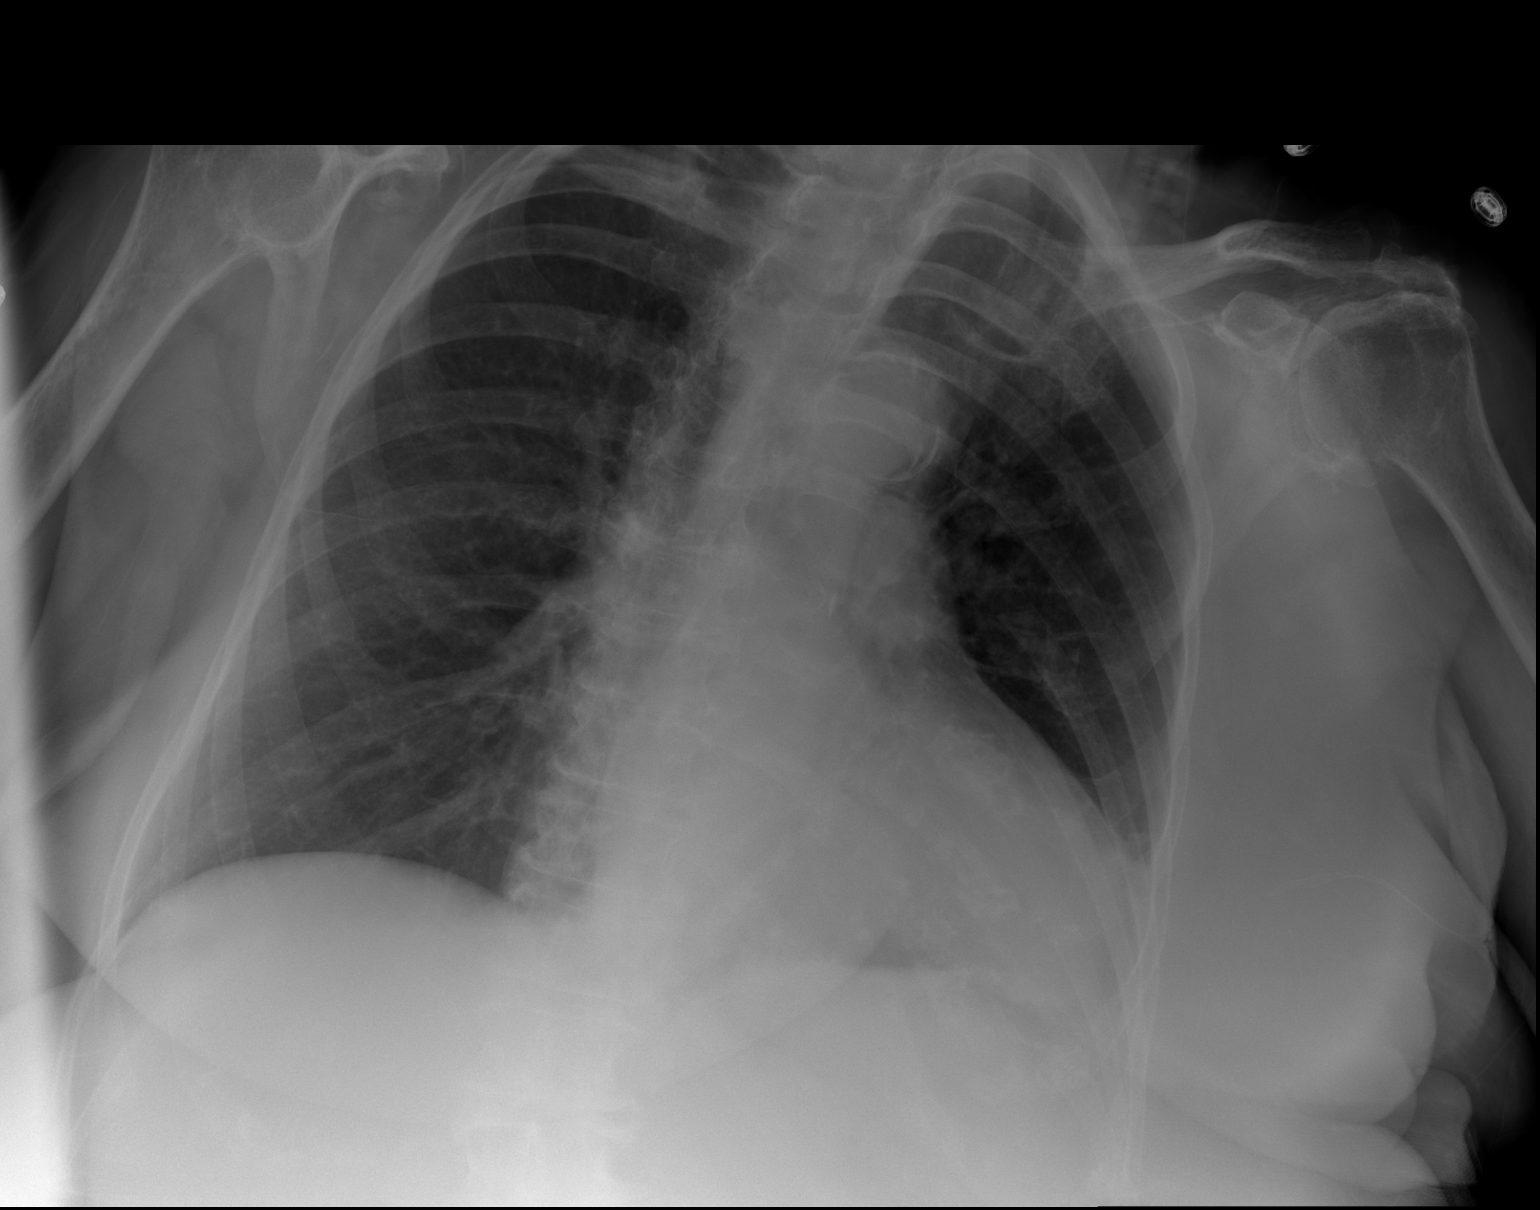

[w chest lat]
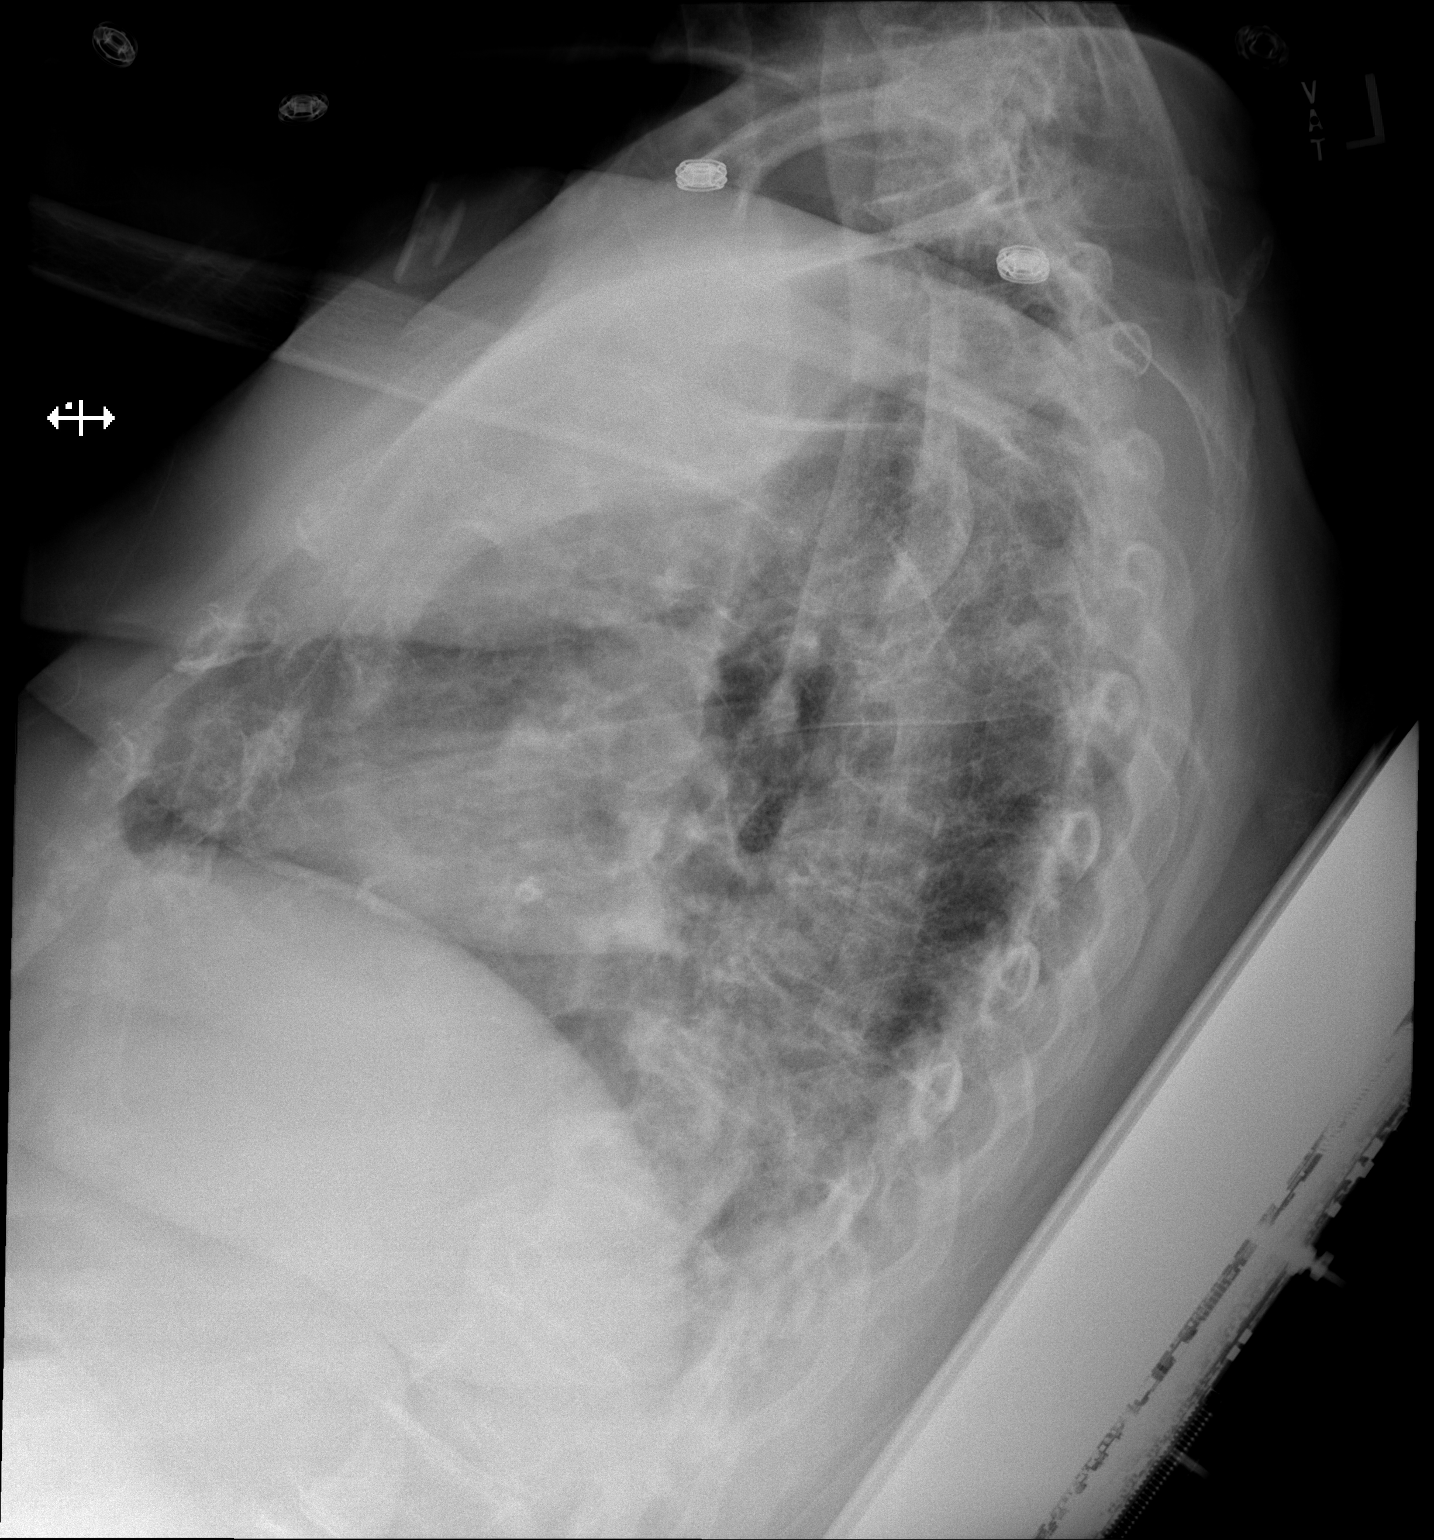

[2 of 2 positions shown; findings below may reference images not displayed]

FINDINGS: Heart size is upper limits of normal, unchanged. Atherosclerotic
calcification of the aortic knob. No focal airspace consolidation,
pleural effusion, or pneumothorax. Degenerative changes of the
bilateral shoulders and thoracic spine.
IMPRESSION: No active cardiopulmonary disease.

## 2021-08-16 IMAGING — CT CT HEAD W/O CM
3 series · 14 of 47 positions shown, 16 images · non-contrast
Comparison: Head CT 04/19/2020.

CLINICAL DATA: Head trauma, minor. Neck trauma. Additional history
provided: Patient reports fall today, hematoma to forehead.

EXAM:
CT HEAD WITHOUT CONTRAST
CT CERVICAL SPINE WITHOUT CONTRAST
TECHNIQUE: Multidetector CT imaging of the head and cervical spine was
performed following the standard protocol without intravenous
contrast. Multiplanar CT image reconstructions of the cervical spine
were also generated.

[Series 2: head wo · axial · 0.47mm/px · z∈[-153,-28]mm · 8 of 31 slices shown, 10 images]
[im 3/31  brain]
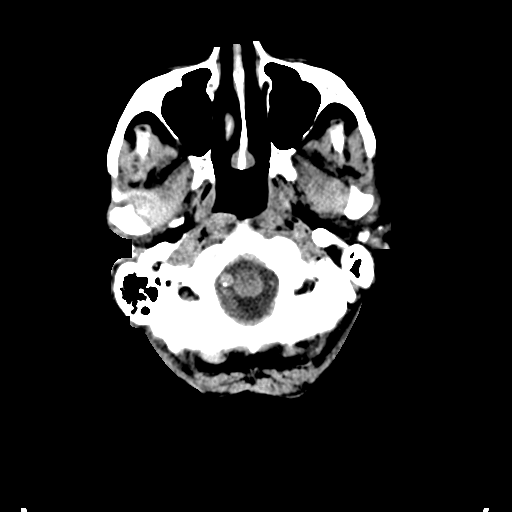
[im 3/31  bone]
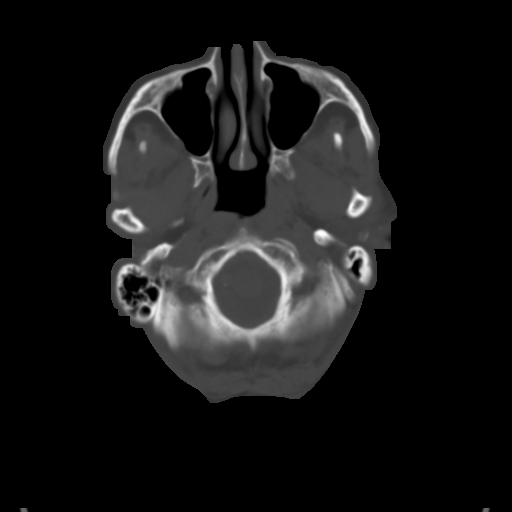
[im 7/31  brain]
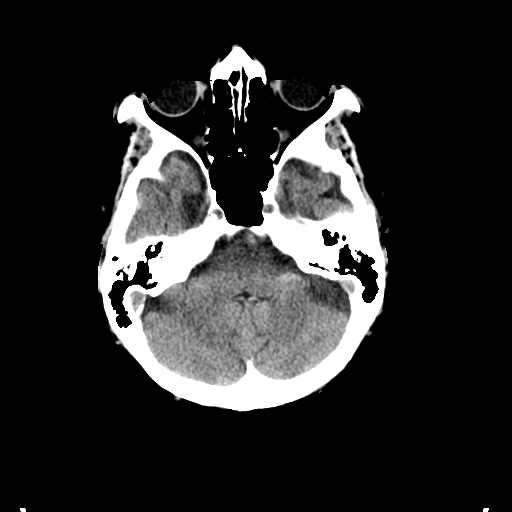
[im 10/31  brain]
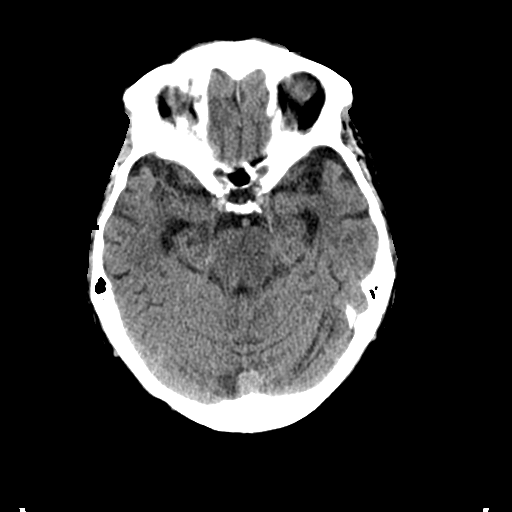
[im 14/31  brain]
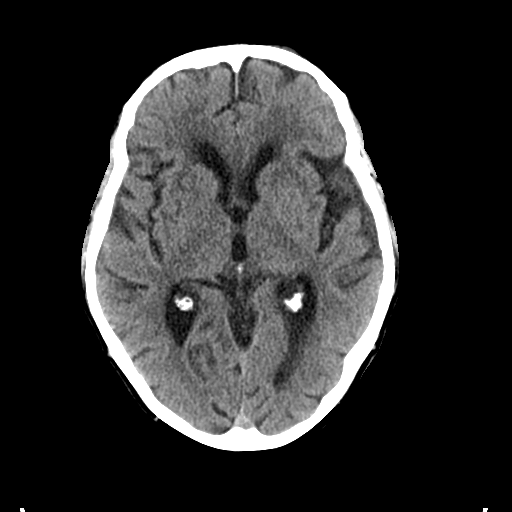
[im 17/31  brain]
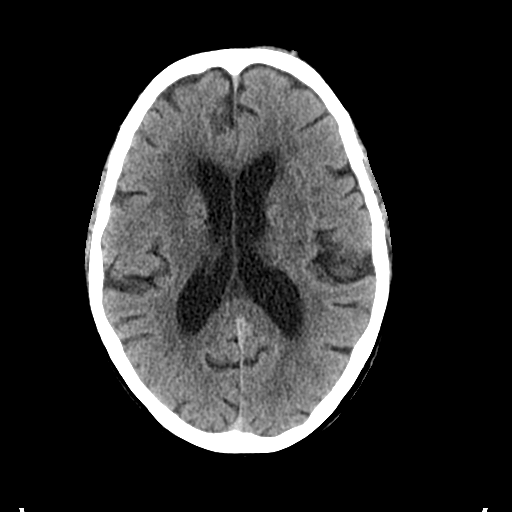
[im 17/31  bone]
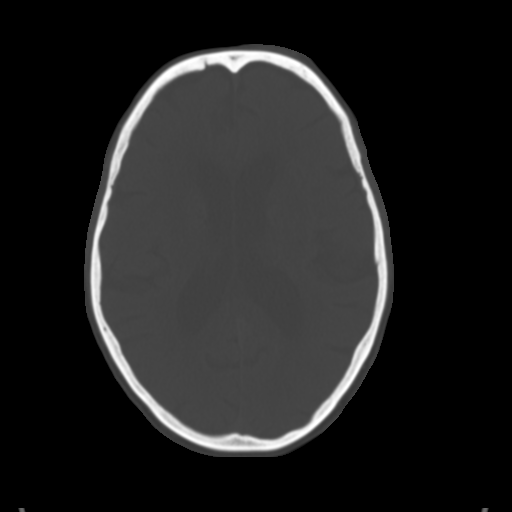
[im 21/31  brain]
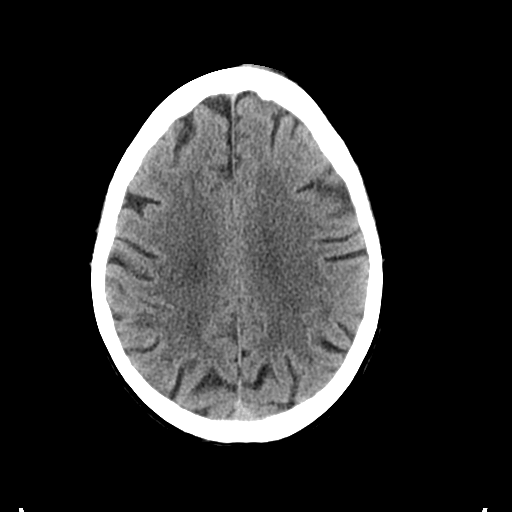
[im 24/31  brain]
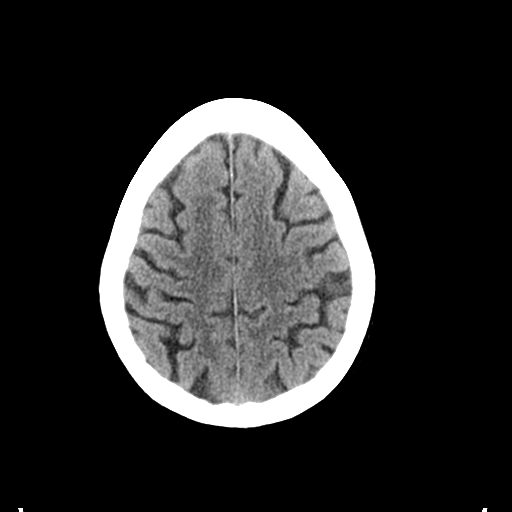
[im 28/31  brain]
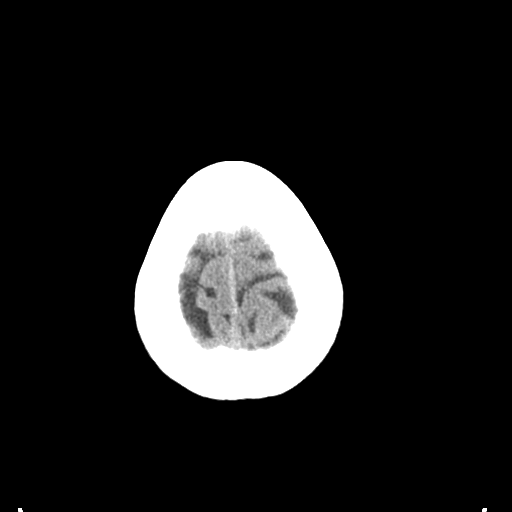

[Series 5: coronal soft tissue · coronal · 0.31mm/px · 3 of 72 slices shown]
[im 24/72  brain]
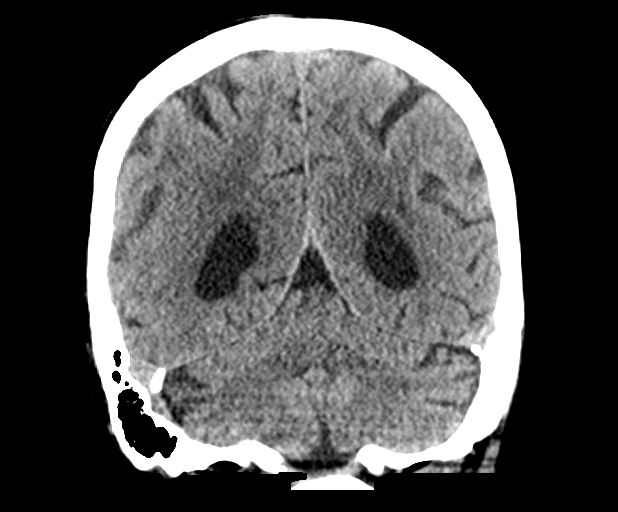
[im 32/72  brain]
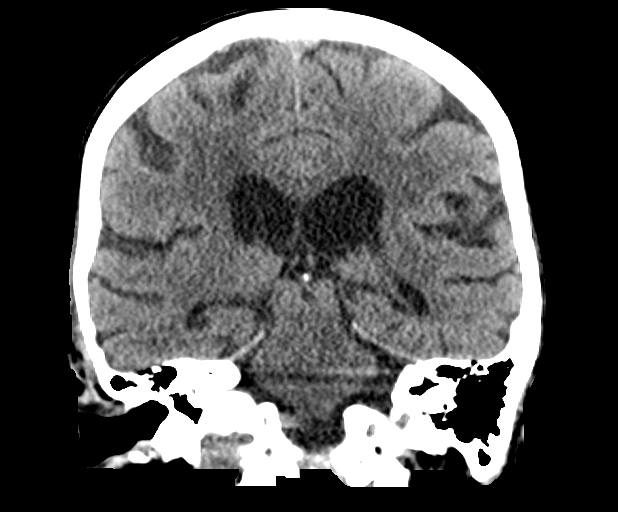
[im 40/72  brain]
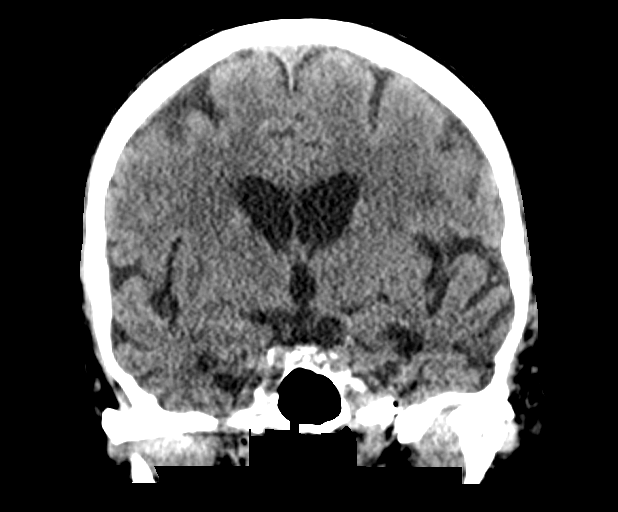

[Series 6: sagittal soft tissue · sagittal · 0.29mm/px · 3 of 58 slices shown]
[im 20/58  brain]
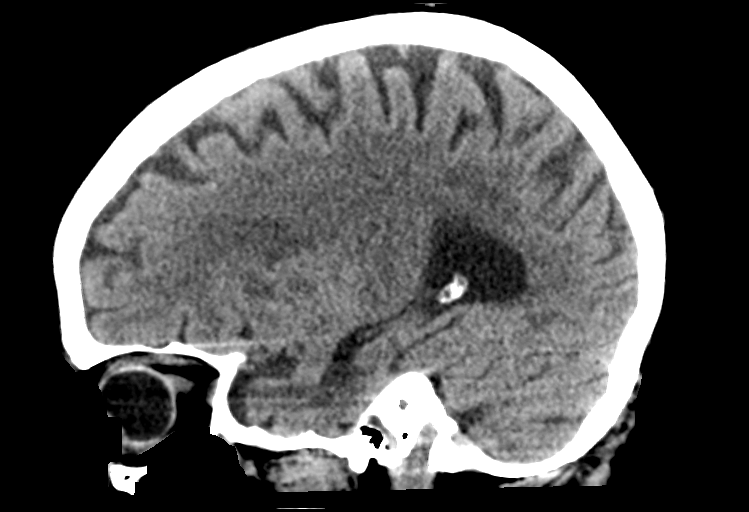
[im 29/58  brain]
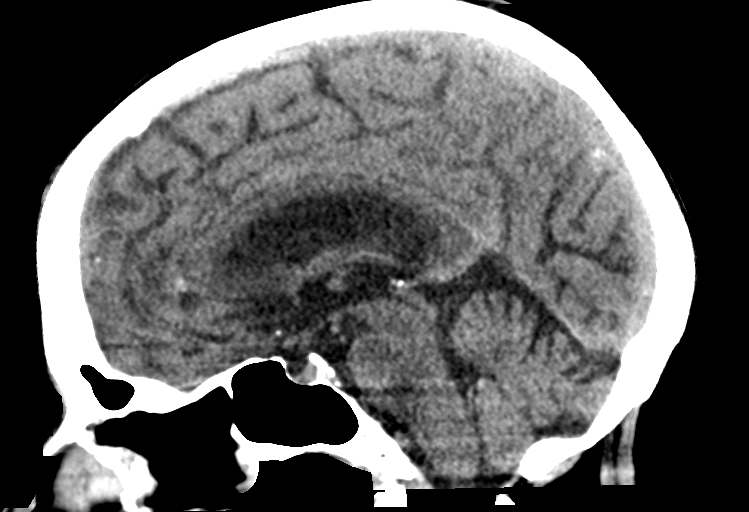
[im 39/58  brain]
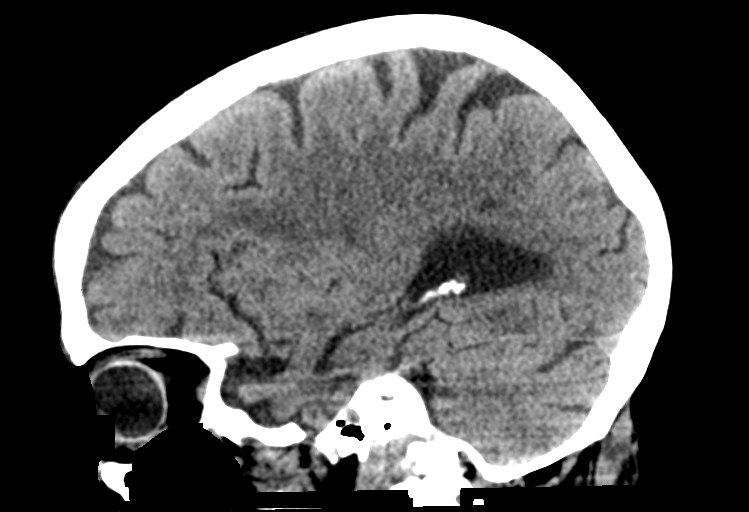

[14 of 47 positions shown; findings below may reference images not displayed]

FINDINGS: CT HEAD FINDINGS

Brain:

Mild-to-moderate cerebral atrophy.

Moderate ill-defined hypoattenuation within the cerebral white
matter is nonspecific, but compatible with chronic small vessel
ischemic disease.

Well circumscribed hypodensity within the inferior left basal
ganglia likely reflecting a prominent perivascular space.

There is no acute intracranial hemorrhage.

No demarcated cortical infarct.

No extra-axial fluid collection.

No evidence of intracranial mass.

No midline shift.

Vascular: No hyperdense vessel.  Atherosclerotic calcifications

Skull: Normal. Negative for fracture or focal lesion.

Sinuses/Orbits: Visualized orbits show no acute finding. Mild
mucosal thickening and small mucous retention cyst within the
anterior left ethmoid air cells.

Other: Left forehead hematoma with possible laceration at this site.

CT CERVICAL SPINE FINDINGS

Alignment: Cervical levocurvature. Straightening of the expected
cervical lordosis. 2 mm C2-C3 and C3-C4 grade 1 anterolisthesis. 2
mm C7-T1 grade 1 anterolisthesis.

Skull base and vertebrae: The basion-dental and atlanto-dental
intervals are maintained.No evidence of acute fracture to the
cervical spine.

Soft tissues and spinal canal: No prevertebral fluid or swelling. No
visible canal hematoma.

Disc levels: Advanced cervical spondylosis with multilevel disc
space narrowing, disc bulges, uncovertebral hypertrophy and facet
arthrosis. Ventral osteophytes at the C2-C3 through C7-T1 levels.
Possible early osseous fusion across the T1-T2 disc space anteriorly
and posteriorly. Multilevel bony neural foraminal narrowing. No
appreciable high-grade spinal canal stenosis. Additional
degenerative changes with pannus formation at C1-C2.

Upper chest: No consolidation within the imaged lung apices. No
visible pneumothorax.

Other: Heterogeneous, enlarged left thyroid lobe.
IMPRESSION: CT head:

1. No evidence of acute intracranial abnormality.
2. Left forehead soft tissue swelling possible laceration at this
site.
3. Mild-to-moderate cerebral atrophy with moderate cerebral white
matter chronic small vessel ischemic disease.

CT cervical spine:

1. No evidence of acute fracture to the cervical spine.
2. Nonspecific straightening of the expected cervical lordosis.
3. Cervical levocurvature.
4. 2 mm grade 1 anterolisthesis at C2-C3, C3-C4 and C7-T1.
5. Advanced cervical spondylosis as described.
6. Heterogeneous, enlarged left thyroid lobe. Given the patient's
advanced age, thyroid ultrasound may be considered for further
evaluation only as clinically appropriate.

## 2021-08-16 IMAGING — CT CT CERVICAL SPINE W/O CM
3 series · 11 of 33 positions shown, 13 images · non-contrast
Comparison: Head CT 04/19/2020.

CLINICAL DATA: Head trauma, minor. Neck trauma. Additional history
provided: Patient reports fall today, hematoma to forehead.

EXAM:
CT HEAD WITHOUT CONTRAST
CT CERVICAL SPINE WITHOUT CONTRAST
TECHNIQUE: Multidetector CT imaging of the head and cervical spine was
performed following the standard protocol without intravenous
contrast. Multiplanar CT image reconstructions of the cervical spine
were also generated.

[Series 3: c spine soft · axial · 0.26mm/px · z∈[-282,-176]mm · 3 of 87 slices shown, 4 images]
[im 20/87  soft-tissue]
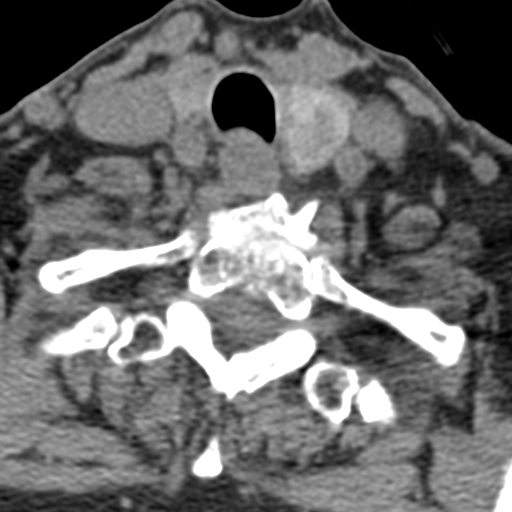
[im 20/87  bone]
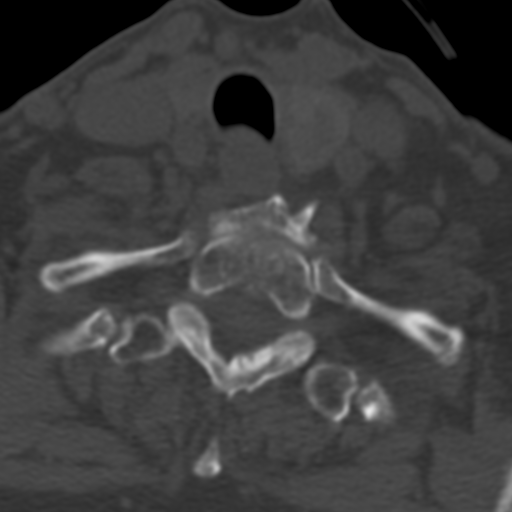
[im 47/87  bone]
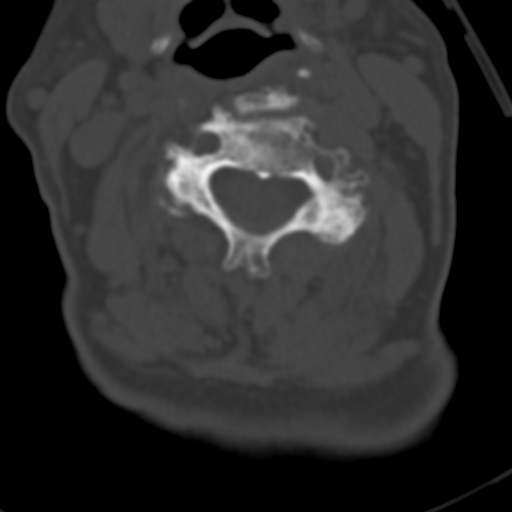
[im 73/87  bone]
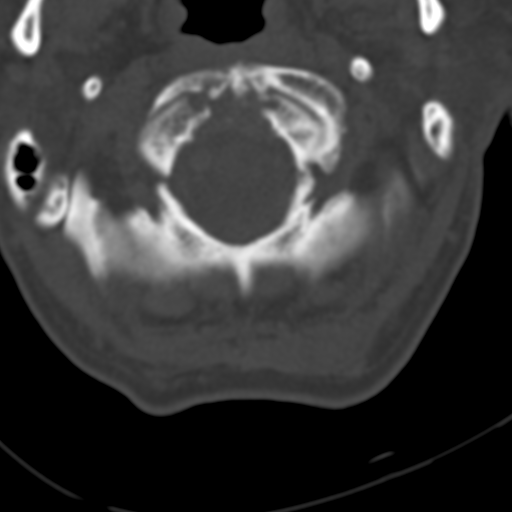

[Series 6: coronal bone · coronal · 0.24mm/px · 3 of 61 slices shown]
[im 13/61  bone]
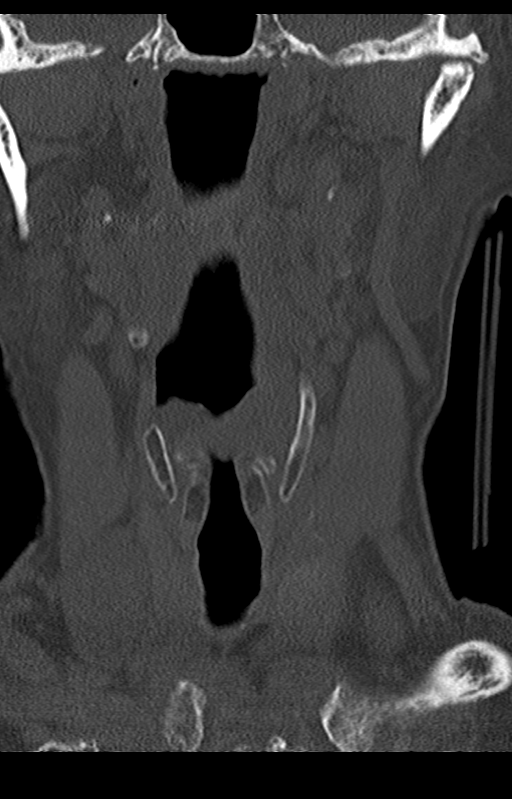
[im 25/61  bone]
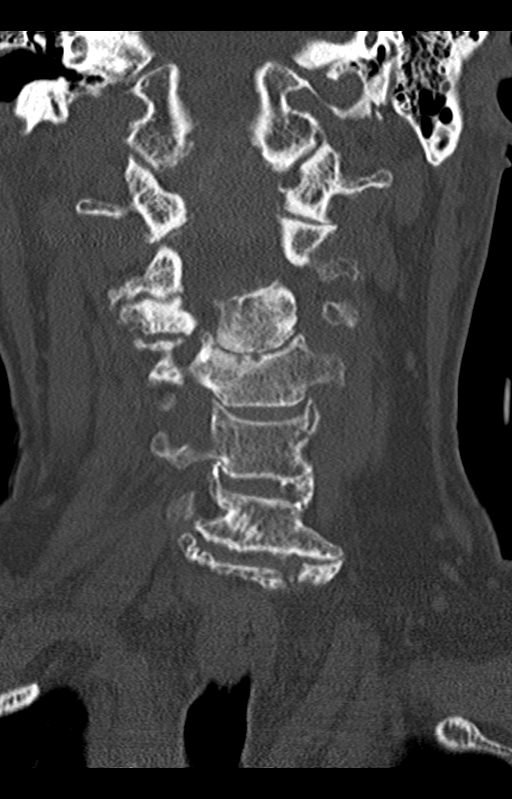
[im 37/61  bone]
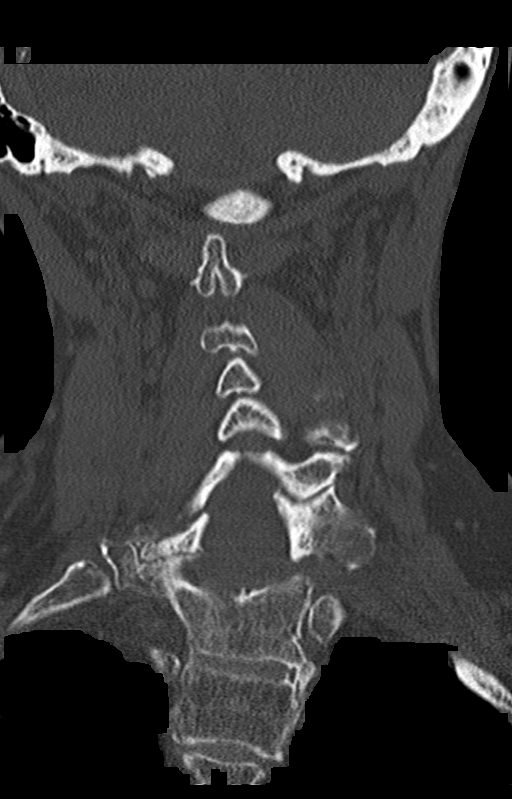

[Series 7: sagittal bone · sagittal · 0.25mm/px · 5 of 61 slices shown, 6 images]
[im 21/61  bone]
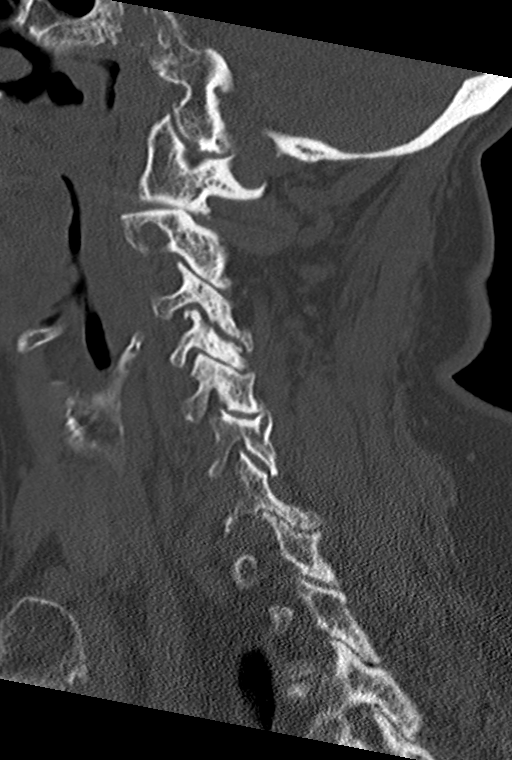
[im 26/61  bone]
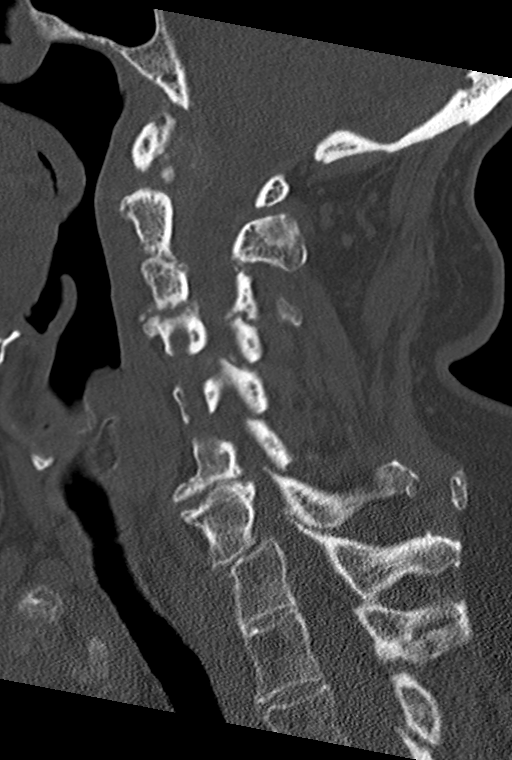
[im 31/61  soft-tissue]
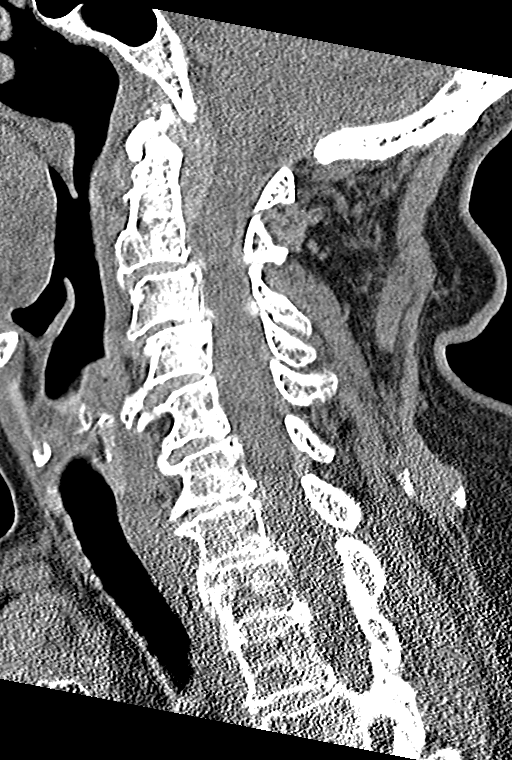
[im 31/61  bone]
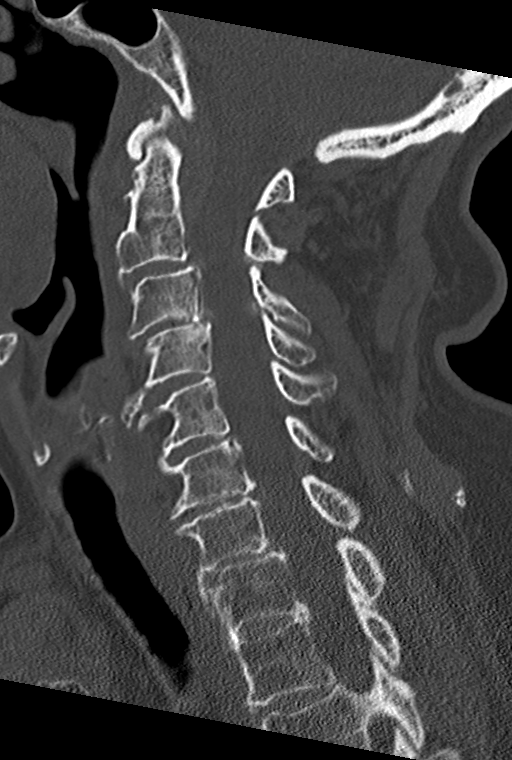
[im 36/61  bone]
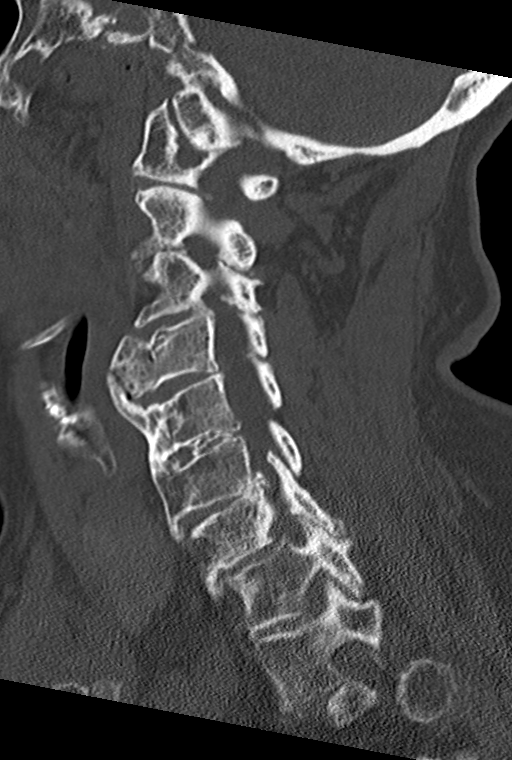
[im 41/61  bone]
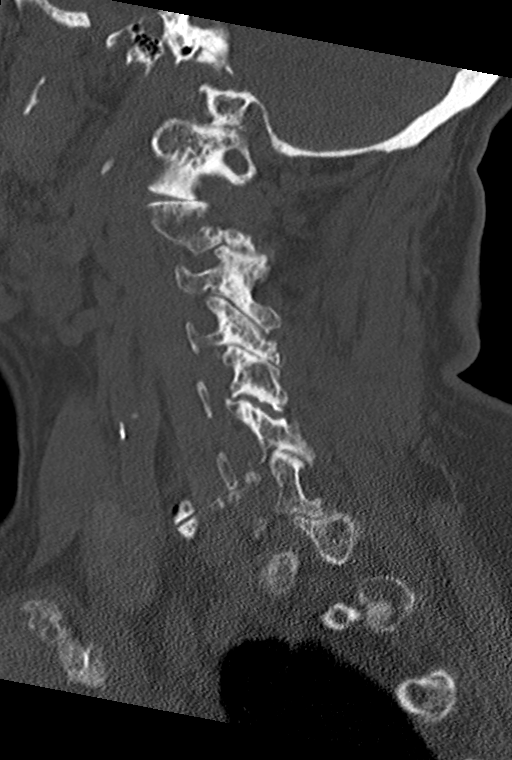

[11 of 33 positions shown; findings below may reference images not displayed]

FINDINGS: CT HEAD FINDINGS

Brain:

Mild-to-moderate cerebral atrophy.

Moderate ill-defined hypoattenuation within the cerebral white
matter is nonspecific, but compatible with chronic small vessel
ischemic disease.

Well circumscribed hypodensity within the inferior left basal
ganglia likely reflecting a prominent perivascular space.

There is no acute intracranial hemorrhage.

No demarcated cortical infarct.

No extra-axial fluid collection.

No evidence of intracranial mass.

No midline shift.

Vascular: No hyperdense vessel.  Atherosclerotic calcifications

Skull: Normal. Negative for fracture or focal lesion.

Sinuses/Orbits: Visualized orbits show no acute finding. Mild
mucosal thickening and small mucous retention cyst within the
anterior left ethmoid air cells.

Other: Left forehead hematoma with possible laceration at this site.

CT CERVICAL SPINE FINDINGS

Alignment: Cervical levocurvature. Straightening of the expected
cervical lordosis. 2 mm C2-C3 and C3-C4 grade 1 anterolisthesis. 2
mm C7-T1 grade 1 anterolisthesis.

Skull base and vertebrae: The basion-dental and atlanto-dental
intervals are maintained.No evidence of acute fracture to the
cervical spine.

Soft tissues and spinal canal: No prevertebral fluid or swelling. No
visible canal hematoma.

Disc levels: Advanced cervical spondylosis with multilevel disc
space narrowing, disc bulges, uncovertebral hypertrophy and facet
arthrosis. Ventral osteophytes at the C2-C3 through C7-T1 levels.
Possible early osseous fusion across the T1-T2 disc space anteriorly
and posteriorly. Multilevel bony neural foraminal narrowing. No
appreciable high-grade spinal canal stenosis. Additional
degenerative changes with pannus formation at C1-C2.

Upper chest: No consolidation within the imaged lung apices. No
visible pneumothorax.

Other: Heterogeneous, enlarged left thyroid lobe.
IMPRESSION: CT head:

1. No evidence of acute intracranial abnormality.
2. Left forehead soft tissue swelling possible laceration at this
site.
3. Mild-to-moderate cerebral atrophy with moderate cerebral white
matter chronic small vessel ischemic disease.

CT cervical spine:

1. No evidence of acute fracture to the cervical spine.
2. Nonspecific straightening of the expected cervical lordosis.
3. Cervical levocurvature.
4. 2 mm grade 1 anterolisthesis at C2-C3, C3-C4 and C7-T1.
5. Advanced cervical spondylosis as described.
6. Heterogeneous, enlarged left thyroid lobe. Given the patient's
advanced age, thyroid ultrasound may be considered for further
evaluation only as clinically appropriate.
